# Patient Record
Sex: Female | Born: 1965 | Race: White | Hispanic: No | Marital: Married | Smoking: Never smoker
Health system: Southern US, Community
[De-identification: ages and names within clinical notes are randomized; demographics above are authoritative.]

## PROBLEM LIST (undated history)

## (undated) DIAGNOSIS — M1990 Unspecified osteoarthritis, unspecified site: Secondary | ICD-10-CM

## (undated) DIAGNOSIS — R0602 Shortness of breath: Secondary | ICD-10-CM

## (undated) DIAGNOSIS — Z90710 Acquired absence of both cervix and uterus: Secondary | ICD-10-CM

## (undated) HISTORY — DX: Acquired absence of both cervix and uterus: Z90.710

## (undated) HISTORY — DX: Unspecified osteoarthritis, unspecified site: M19.90

## (undated) HISTORY — PX: REPLACEMENT TOTAL KNEE BILATERAL: SUR1225

## (undated) HISTORY — PX: APPENDECTOMY: SHX54

## (undated) HISTORY — PX: SHOULDER SURGERY: SHX246

## (undated) HISTORY — PX: CHOLECYSTECTOMY: SHX55

## (undated) HISTORY — DX: Shortness of breath: R06.02

---

## 1997-10-23 ENCOUNTER — Ambulatory Visit (HOSPITAL_BASED_OUTPATIENT_CLINIC_OR_DEPARTMENT_OTHER): Admission: RE | Admit: 1997-10-23 | Discharge: 1997-10-23 | Payer: Self-pay | Admitting: Orthopedic Surgery

## 1998-11-12 ENCOUNTER — Ambulatory Visit (HOSPITAL_BASED_OUTPATIENT_CLINIC_OR_DEPARTMENT_OTHER): Admission: RE | Admit: 1998-11-12 | Discharge: 1998-11-12 | Payer: Self-pay | Admitting: Orthopedic Surgery

## 1999-06-17 ENCOUNTER — Emergency Department (HOSPITAL_COMMUNITY): Admission: EM | Admit: 1999-06-17 | Discharge: 1999-06-17 | Payer: Self-pay | Admitting: Emergency Medicine

## 1999-10-07 ENCOUNTER — Encounter: Payer: Self-pay | Admitting: *Deleted

## 1999-10-07 ENCOUNTER — Emergency Department (HOSPITAL_COMMUNITY): Admission: EM | Admit: 1999-10-07 | Discharge: 1999-10-07 | Payer: Self-pay | Admitting: *Deleted

## 2000-04-11 ENCOUNTER — Encounter: Admission: RE | Admit: 2000-04-11 | Discharge: 2000-04-11 | Payer: Self-pay | Admitting: Family Medicine

## 2000-04-11 ENCOUNTER — Encounter: Payer: Self-pay | Admitting: Family Medicine

## 2001-08-08 ENCOUNTER — Ambulatory Visit: Admission: RE | Admit: 2001-08-08 | Discharge: 2001-08-08 | Payer: Self-pay | Admitting: Orthopedic Surgery

## 2002-01-09 ENCOUNTER — Encounter: Admission: RE | Admit: 2002-01-09 | Discharge: 2002-01-09 | Payer: Self-pay | Admitting: Family Medicine

## 2002-01-09 ENCOUNTER — Encounter: Payer: Self-pay | Admitting: Family Medicine

## 2002-01-12 ENCOUNTER — Emergency Department (HOSPITAL_COMMUNITY): Admission: EM | Admit: 2002-01-12 | Discharge: 2002-01-12 | Payer: Self-pay | Admitting: Podiatry

## 2002-02-12 ENCOUNTER — Inpatient Hospital Stay (HOSPITAL_COMMUNITY): Admission: RE | Admit: 2002-02-12 | Discharge: 2002-02-15 | Payer: Self-pay | Admitting: Orthopedic Surgery

## 2002-05-18 ENCOUNTER — Emergency Department (HOSPITAL_COMMUNITY): Admission: EM | Admit: 2002-05-18 | Discharge: 2002-05-18 | Payer: Self-pay | Admitting: Emergency Medicine

## 2002-05-19 ENCOUNTER — Emergency Department (HOSPITAL_COMMUNITY): Admission: EM | Admit: 2002-05-19 | Discharge: 2002-05-19 | Payer: Self-pay | Admitting: Emergency Medicine

## 2002-11-23 ENCOUNTER — Encounter: Payer: Self-pay | Admitting: Family Medicine

## 2002-11-23 ENCOUNTER — Encounter: Admission: RE | Admit: 2002-11-23 | Discharge: 2002-11-23 | Payer: Self-pay | Admitting: Family Medicine

## 2002-12-07 ENCOUNTER — Encounter: Admission: RE | Admit: 2002-12-07 | Discharge: 2002-12-07 | Payer: Self-pay | Admitting: Family Medicine

## 2002-12-21 ENCOUNTER — Encounter: Admission: RE | Admit: 2002-12-21 | Discharge: 2002-12-21 | Payer: Self-pay | Admitting: Family Medicine

## 2003-04-05 ENCOUNTER — Inpatient Hospital Stay (HOSPITAL_COMMUNITY): Admission: RE | Admit: 2003-04-05 | Discharge: 2003-04-08 | Payer: Self-pay | Admitting: Obstetrics and Gynecology

## 2003-04-05 ENCOUNTER — Encounter (INDEPENDENT_AMBULATORY_CARE_PROVIDER_SITE_OTHER): Payer: Self-pay | Admitting: Specialist

## 2003-06-24 ENCOUNTER — Inpatient Hospital Stay (HOSPITAL_COMMUNITY): Admission: RE | Admit: 2003-06-24 | Discharge: 2003-06-26 | Payer: Self-pay | Admitting: Orthopedic Surgery

## 2003-10-22 ENCOUNTER — Other Ambulatory Visit: Admission: RE | Admit: 2003-10-22 | Discharge: 2003-10-22 | Payer: Self-pay | Admitting: Obstetrics and Gynecology

## 2003-12-20 ENCOUNTER — Encounter: Admission: RE | Admit: 2003-12-20 | Discharge: 2003-12-20 | Payer: Self-pay | Admitting: Emergency Medicine

## 2004-07-12 IMAGING — CT CT PELVIS W/ CM
1 series · 15 of 32 positions shown, 19 images · non-contrast
Comparison: none

CLINICAL DATA: 37 year old.  Low back and abdominal pain.  (contrast code:   D0R.N).

[Series 2: — · axial · 0.86mm/px · z∈[-411,-21]mm · 15 of 120 slices shown, 19 images]
[im 8/120  soft-tissue]
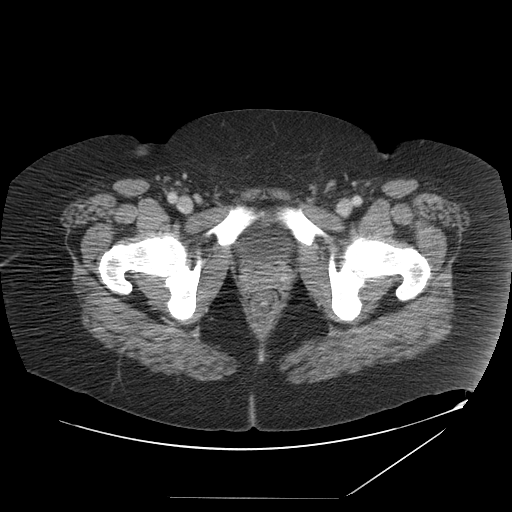
[im 8/120  bone]
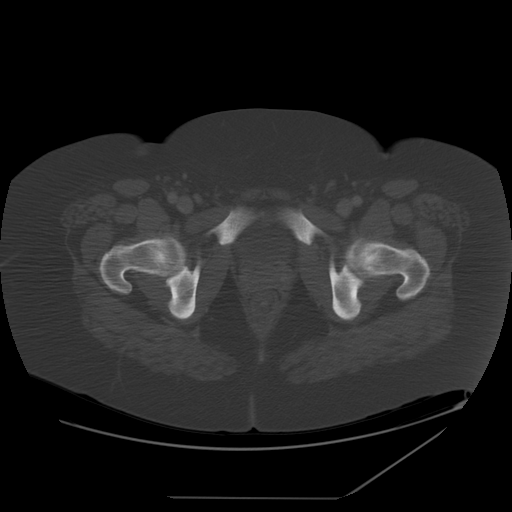
[im 16/120  soft-tissue]
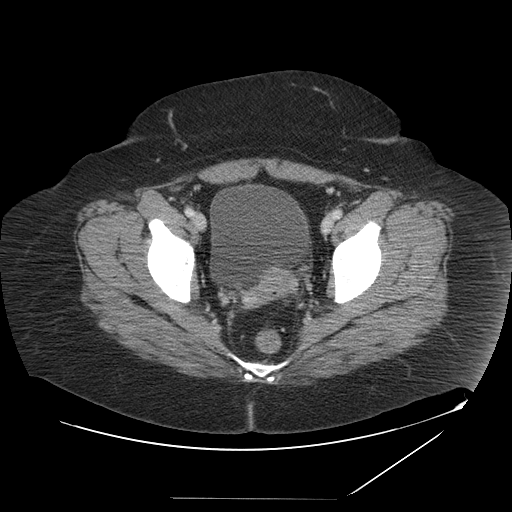
[im 24/120  soft-tissue]
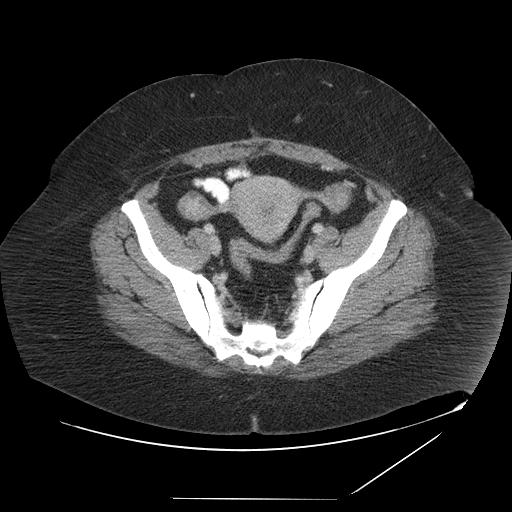
[im 35/120  soft-tissue]
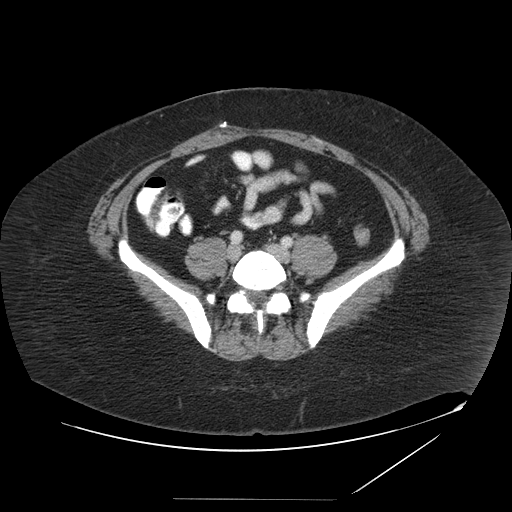
[im 43/120  soft-tissue]
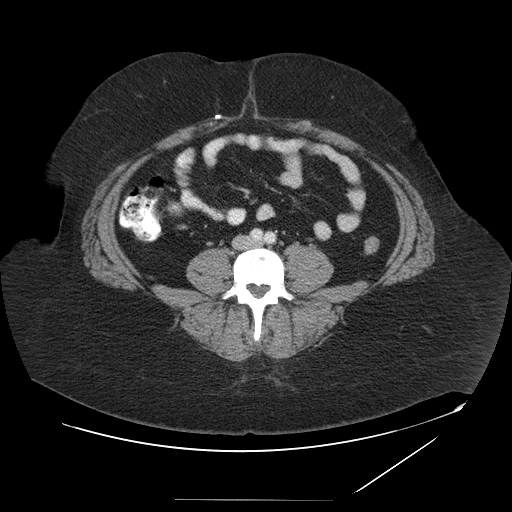
[im 50/120  soft-tissue]
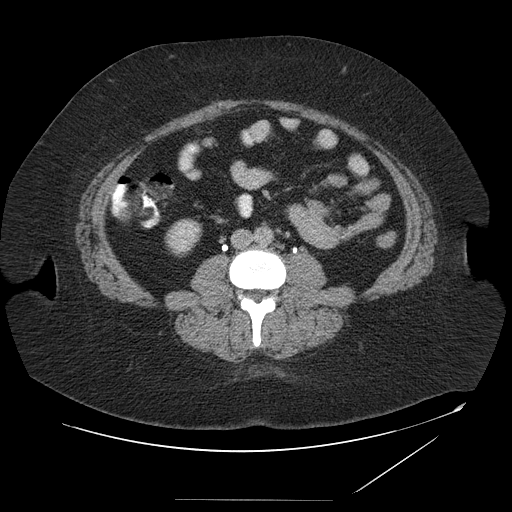
[im 62/120  soft-tissue]
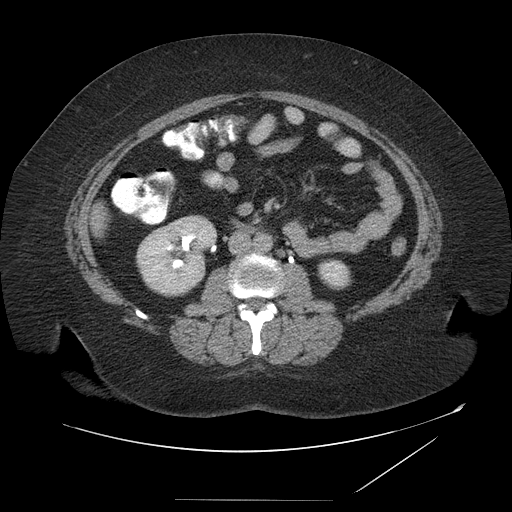
[im 70/120  soft-tissue]
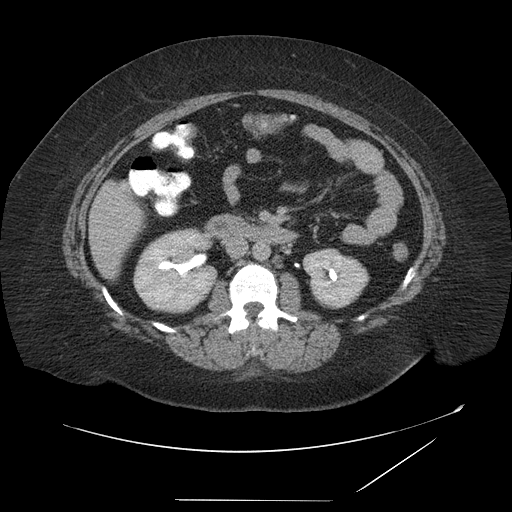
[im 77/120  soft-tissue]
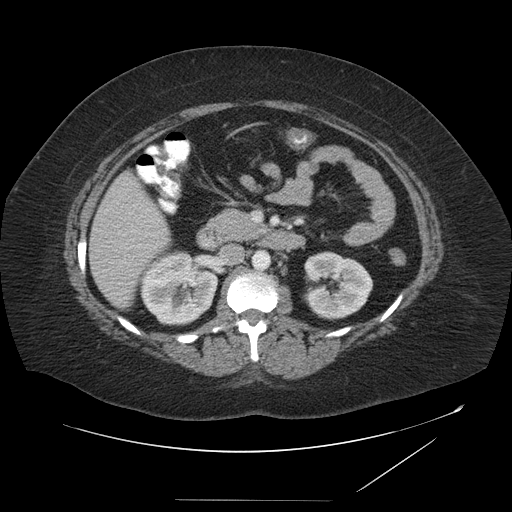
[im 77/120  bone]
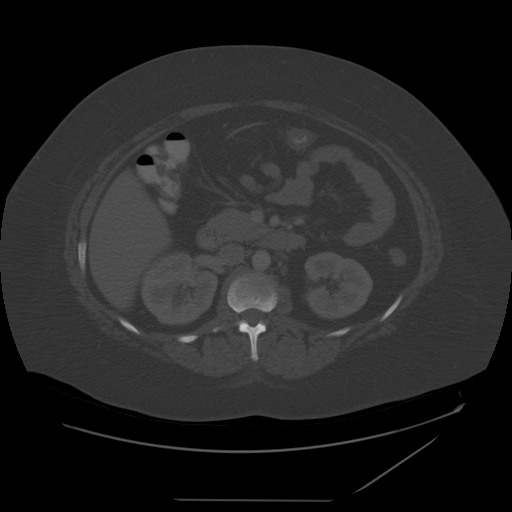
[im 85/120  soft-tissue]
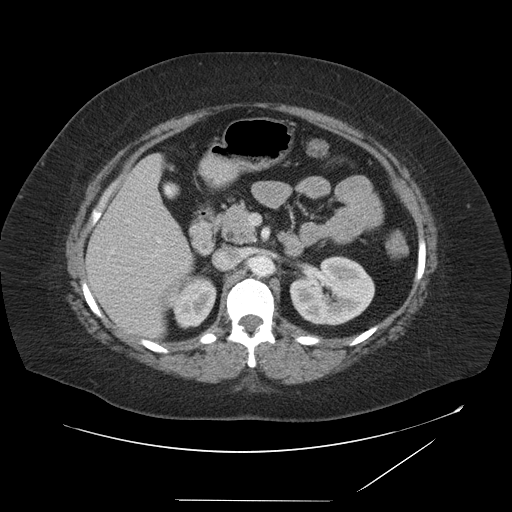
[im 96/120  soft-tissue]
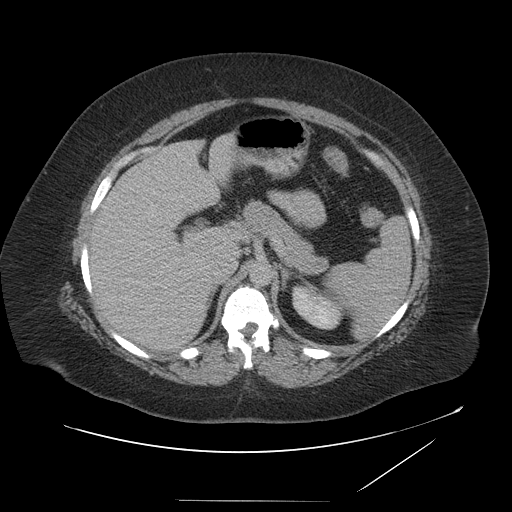
[im 104/120  soft-tissue]
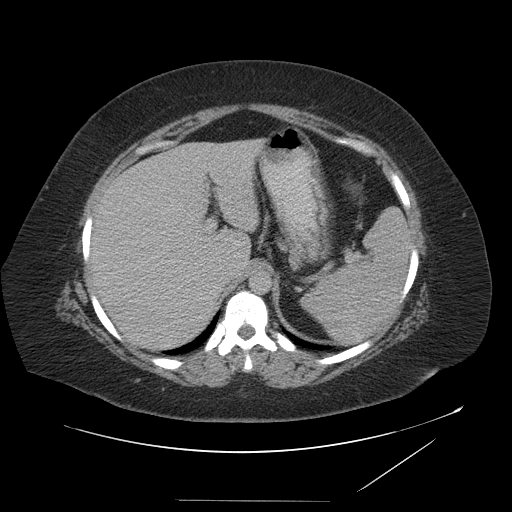
[im 104/120  lung]
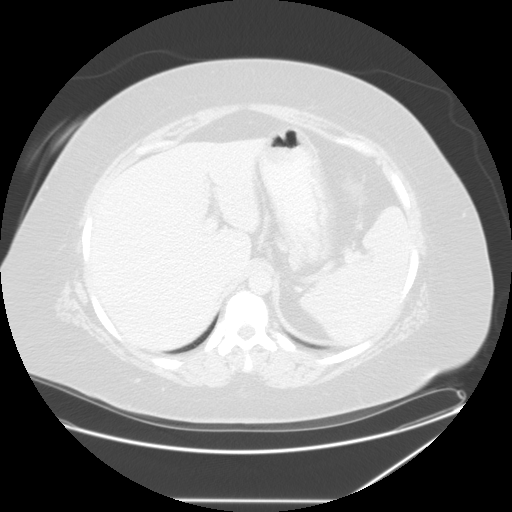
[im 108/120  lung]
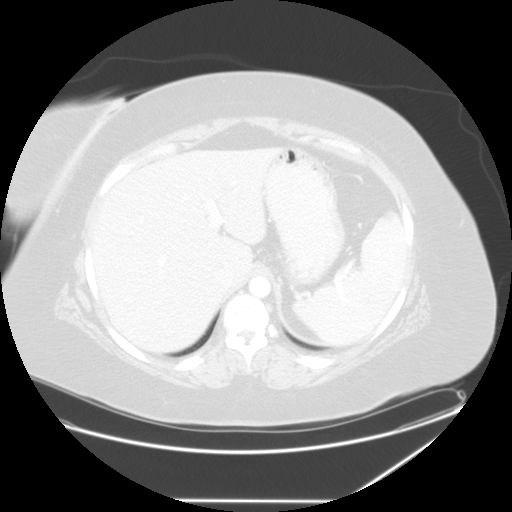
[im 112/120  soft-tissue]
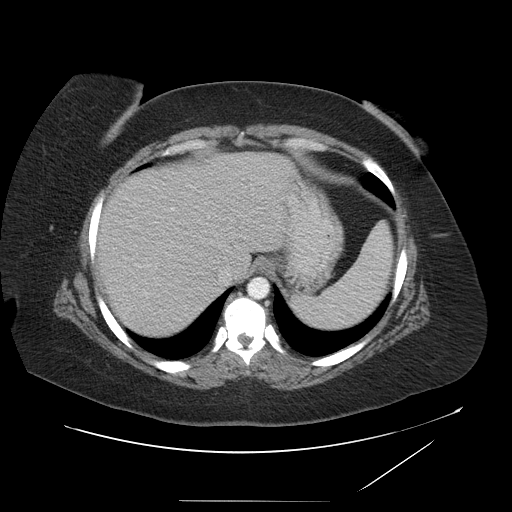
[im 112/120  lung]
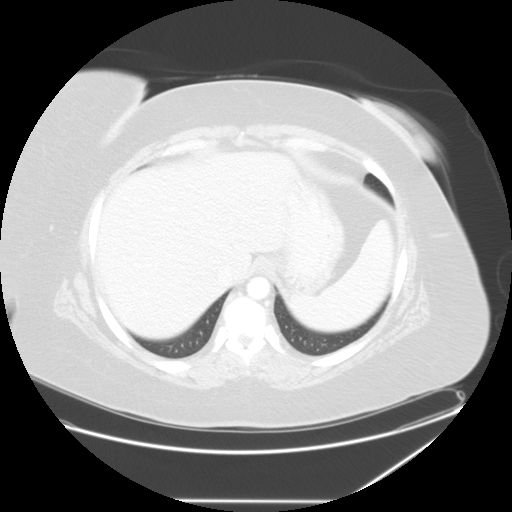
[im 116/120  lung]
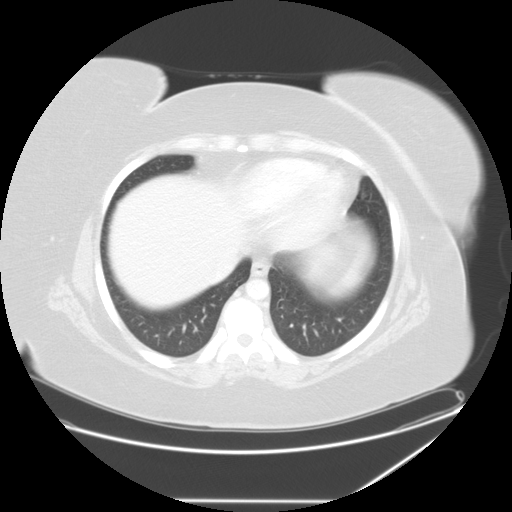

[15 of 32 positions shown; findings below may reference images not displayed]

CT ABDOMEN AND PELVIS WITH CONTRAST
 Helical CT of the abdomen and pelvis was performed after bolus infusion of a total of 150 cc of Omnipaque 300 and the use of dilute oral contrast.  This study is compared to the prior CT from 01/09/02 and also a study from 10/07/99.

 The lung bases are clear.

 CT ABDOMEN:
 The liver, spleen, pancreas, adrenal glands, and kidneys demonstrate no abnormalities.  The patient has had cholecystectomy.  The stomach is fairly well distended and demonstrates no significant findings.  The duodenum, small bowel, and colon appear normal also.  The aorta and major branch vessels are normal.  No mesenteric or retroperitoneal masses or adenopathy.

 Of note the patient appears to have a calcified disk herniation on the right of what I believe is T11-12.  It is possible it could be the cause of the patient?s back pain.  MRI may be helpful for further evaluation of this finding if it is felt clinically necessary.

 IMPRESSION 
   Unremarkable      CT examination of the abdomen.
  Right      paracentral calcified disk herniation at T[DATE] be helpful for further evaluation of this finding if      clinically indicated.  Recommend correlation      with patient?s symptoms.

 CT PELVIS:
 The uterus and ovaries appear normal.  The bladder is normal.  the rectum, sigmoid colon, visualized small bowel loops, and cecum are normal.  No pelvic masses, adenopathy, or free pelvic fluid collections.

 IMPRESSION
 Unremarkable CT pelvis.

## 2005-03-31 ENCOUNTER — Encounter: Admission: RE | Admit: 2005-03-31 | Discharge: 2005-03-31 | Payer: Self-pay | Admitting: Emergency Medicine

## 2006-09-16 ENCOUNTER — Encounter: Admission: RE | Admit: 2006-09-16 | Discharge: 2006-09-16 | Payer: Self-pay | Admitting: Emergency Medicine

## 2007-10-20 ENCOUNTER — Emergency Department (HOSPITAL_COMMUNITY): Admission: EM | Admit: 2007-10-20 | Discharge: 2007-10-20 | Payer: Self-pay | Admitting: Emergency Medicine

## 2008-06-27 ENCOUNTER — Emergency Department (HOSPITAL_COMMUNITY): Admission: EM | Admit: 2008-06-27 | Discharge: 2008-06-27 | Payer: Self-pay | Admitting: Emergency Medicine

## 2008-07-10 ENCOUNTER — Inpatient Hospital Stay (HOSPITAL_COMMUNITY): Admission: AD | Admit: 2008-07-10 | Discharge: 2008-07-10 | Payer: Self-pay | Admitting: Obstetrics and Gynecology

## 2008-07-10 ENCOUNTER — Ambulatory Visit: Payer: Self-pay | Admitting: Obstetrics and Gynecology

## 2010-05-18 LAB — URINALYSIS, ROUTINE W REFLEX MICROSCOPIC
Bilirubin Urine: NEGATIVE
Hgb urine dipstick: NEGATIVE
Ketones, ur: NEGATIVE mg/dL
Nitrite: NEGATIVE
Protein, ur: NEGATIVE mg/dL
Specific Gravity, Urine: 1.01 (ref 1.005–1.030)
Urobilinogen, UA: 0.2 mg/dL (ref 0.0–1.0)

## 2010-05-18 LAB — URINE MICROSCOPIC-ADD ON

## 2010-05-18 LAB — GC/CHLAMYDIA PROBE AMP, URINE: Chlamydia, Swab/Urine, PCR: NEGATIVE

## 2010-06-26 NOTE — H&P (Signed)
NAME:  Paula Fields, Paula Fields NO.:  [id number]   MEDICAL RECORD NO.:  [id number]                   PATIENT TYPE:  OBV   LOCATION:  9399                                 FACILITY:  [facility]   PHYSICIAN:  Juluis Mire, M.D.                DATE OF BIRTH:  May 18, 1965   DATE OF ADMISSION:  04/05/2003  DATE OF DISCHARGE:                                HISTORY & PHYSICAL   REASON FOR ADMISSION:  Patient a 45 year old gravida, para 2, married white  female presents for laparoscopic assisted vaginal hysterectomy.   BRIEF HISTORY:  In relation to the present admission, patient was initially  seen in our practice in October 2004.  At that time her cycles were every 28  days.  She had 7 days of flow.  She described all 7 days being heavy  changing pads 10 times per day with clots.  Associated with this was  increasing dysmenorrhea.  Discomfort began midcycle and increased with her  menstrual flow.  It was so intense at times she could not walk.  She has  used over-the-counter measures without any significant response.  It is of  note she has had a previous bilateral tubal ligation.  She was also having  problems with deep dyspareunia.  This was not as limiting as the menstrual  cramping.  We set up an ultrasound evaluation in the office.  Ultrasound  evaluation revealed a small anterior fibroid but it was otherwise  unremarkable.  We placed her on birth control pills.  Despite these she  continued to have significantly heavy flow with resultant anemia.  Eventually we were able to control the bleeding with increasing doses of  birth control pills and she was eventually on Ovcon-50.  Her hemoglobin  again was slightly depressed at 11.3.  After continued bleeding problems and  pain the patient decided to proceed with definitive therapy in the form of  laparoscopic assisted vaginal hysterectomy.  Alternatives have been  discussed including possible continuation with birth control  pills.  Hysteroscopy/laparoscopy with possible endometrial ablation.   ALLERGIES:  She has no known drug allergies.   MEDICATIONS:  None.   PAST MEDICAL HISTORY:  She has had usual childhood diseases without any  significant sequelae.   PAST SURGICAL HISTORY:  She has had a tonsillectomy.  She has had several  knee surgeries on both the right and left.  She has had her gallbladder  removed in 2001.  She had an appendectomy at 11th grade.  She has had  previous shoulder surgery.  She has had two prior cesarean sections with a  bilateral tubal ligation done with the last one.   FAMILY HISTORY:  Significant in that her sister has a history of lupus  erythematosus, mother with a history of heart disease with triple bypass  surgery.   SOCIAL HISTORY:  No tobacco or alcohol use.  REVIEW OF SYSTEMS:  Noncontributory.   PHYSICAL EXAMINATION:  GENERAL:  Patient is afebrile with stable vital  signs.  HEENT:  Patient is normocephalic.  Pupils equal, round and reactive to light  and accommodation.  Extraocular movements are intact.  Sclerae and  conjunctivae clear.  Oropharynx clear.  NECK:  Without thyromegaly.  BREASTS:  No discrete masses.  LUNGS:  Clear.  CARDIOVASCULAR SYSTEM:  Regular rate.  No murmurs or gallops.  ABDOMEN:  Benign.  She does have numerous incisions.  She has one in the  right lower quadrant from her previous appendectomy and a low transverse  incision from C-sections.  PELVIC:  Normal external genitalia.  Vaginal mucosa clear.  Cervix  unremarkable.  Uterus upper limits of normal size.  Adnexa unremarkable.  Rectovaginal exam is clear.  EXTREMITIES:  Trace edema.  NEUROLOGIC:  Grossly within normal limits.   IMPRESSION:  Menorrhagia/dysmenorrhea unresponsive to conservative  management.   PLAN:  The patient to undergo laparoscopic assisted vaginal hysterectomy.  Again she has reviewed other options.  It is of note she has had all normal  Pap smears.  Her  last one was in October 2004 with her primary care doctor.  We have discussed the risk of surgery including the risk of infection.  The  risk of hemorrhage that can necessitate transfusion with the risk of AIDS or  hepatitis.  The risk of injury to adjacent organs including bladder, bowel,  or ureters that could require further exploratory surgery.  Risk of deep  venous thrombosis and pulmonary embolus.  Patient expressed understanding of  indications and risks.                                               Juluis Mire, M.D.    JSM/MEDQ  D:  04/05/2003  T:  04/05/2003  Job:  [id number]

## 2010-06-26 NOTE — H&P (Signed)
Paula Fields, Paula Fields                           ACCOUNT NO.:  [id number]   MEDICAL RECORD NO.:  [id number]                   PATIENT TYPE:  INP   LOCATION:  0470                                 FACILITY:  [facility]   PHYSICIAN:  Ollen Gross, M.D.                 DATE OF BIRTH:  Dec 02, 1965   DATE OF ADMISSION:  06/24/2003  DATE OF DISCHARGE:  06/26/2003                                HISTORY & PHYSICAL   CHIEF COMPLAINT:  Left knee pain.   HISTORY OF PRESENT ILLNESS:  The patient is a 45 year old female, well known  to Dr. 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  She has previously had the right knee replaced and has done  quite well with this.  The left knee has continued to increase and become  more symptomatic.  She was seen in office and had advanced patellofemoral  arthritis.  It was felt due to the fact she has done well with a previous  surgery, she would benefit from undergoing a left knee patellofemoral  replacement.  Risks and benefits of this surgery have been discussed with  the patient who elected to proceed with surgery.   ALLERGIES:  RELAFEN causes sickness.   CURRENT MEDICATIONS:  Aleve p.r.n.Marland Kitchen   PAST MEDICAL HISTORY:  Arthritis.   PAST SURGICAL HISTORY:  1. Multiple knee surgeries with 8 surgeries on the right knee and 3     surgeries on the left knee.  2. She has also had right shoulder surgery.  3. Cesarean section x 2.  4. Gallbladder surgery.  5. Appendix surgery.  6. Tonsillectomy.  7. Hysterectomy.   SOCIAL HISTORY:  Married.  She is currently on disability, nonsmoker, no  alcohol, has two children, one-story home, no steps.   FAMILY HISTORY:  Father living age 2, is healthy.  Mother age 65.  She has  had two brain surgeries and also has had a three-vessel CABG.  She has a  sister, approximately age 50 with lupus, another sister age 8 who has also  undergone brain surgery.   REVIEW OF SYSTEMS:  GENERAL:  No  fevers, chills, or night sweats.  NEUROLOGIC:  No seizures, syncope, paralysis.  RESPIRATORY:  No shortness of  breath, productive cough, or hemoptysis.  CARDIOVASCULAR:  No chest pain,  angina, or orthopnea.  GI:  No nausea, vomiting, diarrhea, or constipation.  GU:  No dysuria, hematuria, or discharge.  MUSCULOSKELETAL:  Pertinent to  the left knee found in the history of present illness.   PHYSICAL EXAMINATION:  VITAL SIGNS:  Pulse 68, respirations 12, blood  pressure 110/78.  GENERAL:  A 45 year old white female, well-nourished, well-developed, short  stature, overweight, no acute distress.  HEENT:  Normocephalic, atraumatic.  Pupils are round and reactive.  Oropharynx is clear.  NECK:  Supple.  No carotid bruits.  CHEST:  Clear anterior and posterior chest  walls.  No rhonchi, rales, or  wheezing.  HEART:  Regular rate and rhythm.  ABDOMEN:  Soft, protuberant abdomen.  Bowel sounds are present, nontender.  RECTAL/BREASTS/GENITALIA:  Not done.  Not pertinent to present illness.  EXTREMITIES:  Significant to the left knee.  She does have marked crepitus  noted on passive range of motion with pain with extreme extension.  No  effusion.  Positive patella compression test on exam.  Motor function is  intact.   IMPRESSION:  Left knee patellofemoral arthritis.   PLAN:  The patient will be admitted to [facility] to undergo a  left patellofemoral replacement.  Surgery will be performed by Dr. Ollen Gross.     Alexzandrew L. Julien Girt, P.A.              Ollen Gross, M.D.    ALP/MEDQ  D:  07/04/2003  T:  07/05/2003  Job:  [id number]   cc:   L. Lupe Carney, M.D.  [address] [place]  [place] [address]  Fax: [phone]   Ollen Gross, M.D.  [facility]  [address]  [address]  [place]  [place] [address]  Fax: [phone]

## 2010-06-26 NOTE — H&P (Signed)
Paula Fields, Paula Fields                           ACCOUNT NO.:  [id number]   MEDICAL RECORD NO.:  [id number]                   PATIENT TYPE:  INP   LOCATION:  0457                                 FACILITY:  [facility]   PHYSICIAN:  Ollen Gross, M.D.                 DATE OF BIRTH:  [birth year]   DATE OF ADMISSION:  02/12/2002  DATE OF DISCHARGE:                                HISTORY & PHYSICAL   CHIEF COMPLAINT:  Right knee pain.   HISTORY OF PRESENT ILLNESS:  The patient is a 45 year old female who has  been followed by Ollen Gross, M.D. for ongoing right knee pain.  She has  had multiple surgeries on the right knee dating back through 1994, most  recently having bilateral knee scopes back in 2003.  She is known to have  advanced patellofemoral arthritis and has not had much improvement with any  of the previous procedures or treatment.  Her arthritic changes are  localized and isolated to the patellofemoral area of the right knee.  She  continues to have knee pain which interferes with her activity.  Due to the  fact that she has not improved with previous treatments and the fact that  her arthritis is located to the specific area of the patellofemoral region,  it is felt that she could benefit from undergoing a patellofemoral  replacement.  This surgery and the risks and benefits associated with it  have been discussed with the patient at length and she has elected to  proceed with surgery.   ALLERGIES:  No known drug allergies.  Intolerances:  RELAFEN causes  sickness.   CURRENT MEDICATIONS:  None.   PAST MEDICAL HISTORY:  1. History of gallbladder disease.  2. History of bilateral ankle fractures.   PAST SURGICAL HISTORY:  1. Cholecystectomy in 1999.  2. Appendectomy in the 11th grade.  3. She has undergone a tonsillectomy in 1970.  4. She has had two cesarean sections in 1984 and 1988.  5. She has had multiple right knee surgeries dating back from 1994 up until     this  past year 2003.  She also had left knee surgery in 2003.   SOCIAL HISTORY:  Married.  Nonsmoker.  Very seldom intake of alcohol.  She  has two children.  One-story home.  She does have a ramp entering into her  home.   FAMILY HISTORY:  Her mother who is age 26 with coronary arterial disease,  hypertension, and arthritis.  Father age 40 with arthritis.  Grandmother  with a history of stroke and stomach cancer.  COPD, asthma, and lupus also  run in the family.   REVIEW OF SYSTEMS:  GENERAL:  No fevers, chills, night sweats.  NEUROLOGIC:  No seizures, syncope, paralysis.  RESPIRATORY:  No shortness of breath,  productive cough, or hemoptysis.  CARDIOVASCULAR:  No chest pain, angina,  orthopnea.  GASTROINTESTINAL:  No nausea, vomiting, diarrhea, constipation.  No bloody mucus in the stool.  GENITOURINARY:  No dysuria, hematuria, or  discharge.  MUSCULOSKELETAL:  Pertinent to the right knee found in history  of present illness.   PHYSICAL EXAMINATION:  VITAL SIGNS:  Pulse 68, respirations 14, blood  pressure 138/82.  GENERAL:  The patient is a 45 year old white female well-nourished, well-  developed, overweight, short stature.  She is alert, oriented, cooperative.  HEENT:  Normocephalic, atraumatic.  Pupils round, reactive.  Oropharynx  clear.  EOMs are intact.  NECK:  Supple.  No carotid bruits are appreciated.  CHEST:  Clear to auscultation anterior/posterior chest walls.  HEART:  Regular rate and rhythm.  No murmurs.  S1, S2 noted.  ABDOMEN:  Soft, round protuberant abdomen.  Bowel sounds are present.  RECTAL:  Not done.  Not pertinent to present illness.  BREASTS:  Not done.  Not pertinent to present illness.  GENITALIA:  Not done.  Not pertinent to present illness.  EXTREMITIES:  Right lower extremity:  Shows range of motion of 0-105  degrees.  No instability.  She has moderate crepitus noted on passive range  of motion.  She has some patella compression pain.   IMPRESSION:   Right knee patellofemoral arthritis.   PLAN:  The patient will be admitted to [facility] to undergo a  right knee patellofemoral replacement arthroplasty.  Surgery will be  performed by Ollen Gross, M.D.  The patient's medical physician is L. Lupe Carney, M.D.  Elsworth Soho, M.D. will be notified of the room number  on admission, be consulted if needed for any medical assistance with this  patient throughout the hospital course.     Paula Fields, P.A.              Ollen Gross, M.D.    ALP/MEDQ  D:  02/13/2002  T:  02/13/2002  Job:  [id number]   cc:   Ollen Gross, M.D.  [address]  [place]  [place] [address]  Fax: [phone]   L. Lupe Carney, M.D.  [address] [place]  [place] [address]  Fax: [phone]

## 2010-06-26 NOTE — Discharge Summary (Signed)
Paula Fields, Paula Fields                           ACCOUNT NO.:  [id number]   MEDICAL RECORD NO.:  [id number]                   PATIENT TYPE:  INP   LOCATION:  0457                                 FACILITY:  [facility]   PHYSICIAN:  Ollen Gross, M.D.                 DATE OF BIRTH:  08-May-1965   DATE OF ADMISSION:  02/12/2002  DATE OF DISCHARGE:  02/15/2002                                 DISCHARGE SUMMARY   ADMISSION DIAGNOSIS:  Right knee patellofemoral arthritis.   DISCHARGE DIAGNOSIS:  Right knee patellofemoral arthritis status post right  knee patellofemoral arthroplasty.   PROCEDURE:  The patient was taken to the OR on February 12, 2002, underwent a  right knee patellofemoral arthroplasty.   SURGEON:  Ollen Gross, M.D.   ASSISTANT:  Alexzandrew L. Julien Girt, P.A.   ANESTHESIA:  Surgery under general anesthesia.   ESTIMATED BLOOD LOSS:  Minimal.   DRAIN:  Hemovac drain x1.   TOURNIQUET TIME:  Is 51 minutes at 300 mmHg.   BRIEF HISTORY:  The patient is a 45 year old female followed by Dr. Lequita Halt  for ongoing right knee pain.  She has undergone multiple surgeries in the  right knee dating back through 1994, most recent bilateral knee scopes back  in 2003.  She was known to have advanced patellofemoral arthritis and has  not had much improvement with the previous procedures and treatment.  The  arthritic changes appear to be localized at the patellofemoral region in the  right knee and it started to interfere with her activities.  Due to the  localization of the arthritis in the patellofemoral region, it was felt that  the patient could benefit from undergoing a patellofemoral arthroplasty  replacement.  Risks and benefits of this procedure have been discussed with  the patient at length and she has elected to proceed with surgery.   LABORATORY DATA:  CBC on admission:  Hemoglobin 12.4, hematocrit of 37.2,  white cell count 7.6, red cell count 4.64.  Postop H&H 11.4 and 34.3  and  last noted H&H 10.9 and 32.4.  Differential within normal limits on  admission CBC.  PT/PTT on admission was 13.4 and 35 respectively and serial  pro times followed per Coumadin protocol.  Last noted PT/INR 28.7 and 3.4.  CHEM panel on admission all within normal limits with the exception of a low  glucose of 59.  Followup BMET glucose back up to 171, BUN down from 9 to 5,  otherwise electrolytes all within normal limits.  Urinalysis on admission  was negative.  Blood group type O positive.   HOSPITAL COURSE:  The patient was admitted to [facility], taken to  the OR, underwent the above-stated procedure without complication.  The  patient tolerated the patient well, later sent to the recovery room and then  to the orthopedic floor for continued postoperative care where she received  24  hours of postop IV antibiotics.  She was placed on PCA analgesics for  pain control following surgery and supplemented by p.o. meds.  Hemovac drain  was pulled on postop day #1 without difficulty.  PCA was discontinued along  with the IV on postop day #2 when she was weaned over to p.o. meds.  PT was  consulted to assist with gait training ambulation following the surgery.  She was up ambulating 70 feet by postop day #1 and increased up to 110 feet  by postop day #2.  Dressing change initiated on postop day #2.  Incision was  healing very well, staples intact.  By day #3, she was doing much better,  been weaned over to p.o. meds, tolerating her therapy, and it was found the  patient could be discharged home.   DISCHARGE PLAN:  1. The patient discharged home on February 15, 2002.  2. Discharge diagnosis, please see above.  3. Discharge meds:     a. Coumadin as per pharmacy protocol.     b. Percocet for pain.     c. Robaxin for spasm.  4. Diet:  As tolerated.  5. Follow up:  End of the next week following the discharge.  Call the     office for an appointment at [phone].  6. Activity:   Weightbearing as tolerated.  Knee immobilizer when she is up.     Home health PT, home health nursing through [facility].   DISPOSITION:  To home.   CONDITION ON DISCHARGE:  Improved.     Alexzandrew L. Julien Girt, P.A.              Ollen Gross, M.D.    ALP/MEDQ  D:  03/14/2002  T:  03/14/2002  Job:  [id number]

## 2010-06-26 NOTE — Discharge Summary (Signed)
Paula Fields, FRANTA                           ACCOUNT NO.:  [id number]   MEDICAL RECORD NO.:  [id number]                   PATIENT TYPE:  INP   LOCATION:  0470                                 FACILITY:  [facility]   PHYSICIAN:  Ollen Gross, M.D.                 DATE OF BIRTH:  [birth year]   DATE OF ADMISSION:  06/24/2003  DATE OF DISCHARGE:  06/26/2003                                 DISCHARGE SUMMARY   ADMISSION DIAGNOSIS:  Left knee patellofemoral arthritis.   DISCHARGE DIAGNOSIS:  Left knee patella femoral arthritis, status post left  patellofemoral arthroplasty.   CONSULTATIONS:  None.   PROCEDURE:  On 06/24/2003 left patellofemoral arthroplasty.   SURGEON:  Ollen Gross, M.D.   ASSISTANT:  Paula Fields, P.A.C.   ANESTHESIA:  General; postop Marcaine pain pump.   BLOOD LOSS:  Minimal.   DRAINS:  Hemovac drain x1.   TOURNIQUET TIME:  35 minutes at 300 mmHg.   BRIEF HISTORY:  Paula Fields is a 45 year old female with known patellofemoral  arthritis, bilateral knees.  She has successfully undergone a right  patellofemoral arthroplasty.  She is doing well. She has had ongoing  problems regards to her left knee.  Temporary arthroscopic treatments have  provided her temporary relief, but she continues to have disabling pain.  She now presents for patellofemoral arthroplasty on the left.   LABORATORY DATA:  CBC on admission hemoglobin 10.8, hematocrit of 30.0,  white cell count 7.6, red cell count 4.6 with differential well within  normal limits.  Postoperative H&H 10.0 and 31.1; last H&H 9.8 and 30.4.  PT  and PTT preop 13.2 and 32 respectively with an INR of 1.0.  Serum protein is  followed.  Last PT/INR 16.6 and 1.5.  Chem-12 on admission all within normal  limits.  Follow up BMET, sodium dropped from 139 to 130; glucose went up  from 103 to 156; calcium dropped from 8.8 to 8.0; BUN dropped from 7 to 5.  Preop UA moderate leukocyte esterase, a few epithelial cells,  21-50 white  cells, 0-2 red cells, many bacteria; treated preoperatively.  Recheck UA on  admission negative.  Blood group type O positive.  EKG, preop, 06/17/2003,  normal sinus rhythm, normal EKG, no previous tracings, confirmed by Dr.  Jaclyn Fields. Paula Fields.  A chest x-ray report dated 06/17/2003 no active disease.   HOSPITAL COURSE:  The patient admitted to [facility], taken to the  OR, underwent the above stated procedure without complications.  The patient  tolerated the procedure well.  She was later transferred to recovery room  and then __________ for continued  postop care.  Vital signs were followed.  The patient was given 24 hours postoperative IV antibiotics in the form of  Ancef and Coumadin for 2 weeks; negative prophylaxis.  PT and OT were  consulted.  The patient was placed to weight bearing as tolerated.  Hemovac  drain placed on at surgery; was pulled on postoperative day 1.  PCA and IVs  were discontinued also on day 1.  She did have a little bit of pain, but  otherwise doing fairly well.  By day 2 she was even doing better; starting  to get up to the chair and start ambulating with physical therapy.  She  ambulated approximately 100 feet and felt like she was doing well and that  she would be ready for discharge the following day, although Dr. Lequita Fields saw  her later that afternoon.  She was doing quite well and actually wanted to  go home on day 2.   She was ambulating well, incision was healing well, tolerating her meds and  was discharged __________ .   DISCHARGE/PLAN:  1. The patient was discharged home on 06/26/2003.  2. For discharge diagnoses please see above.  3. Discharge Meds:  Percocet, Robaxin, and Coumadin.  4. Diet:  As tolerated.  5. Activity:  Weightbearing as tolerated.  Home heath PT and home health     nursing for knee protocol.  Patellofemoral will be treated as like total     knee protocol as her PT and OT.  6. Follow up:  Two weeks from  surgery.  7. Disposition:  Home.   CONDITION ON DISCHARGE:  Improved.     Paula L. Paula Fields, P.A.              Ollen Gross, M.D.    ALP/MEDQ  D:  07/24/2003  T:  07/24/2003  Job:  [id number]   cc:   Ollen Gross, M.D.  [facility]  [address]  [address]  [place]  [place] [address]  Fax: [phone]   L. Paula Fields, M.D.  [address] [place]  [place] [address]  Fax: [phone]

## 2010-06-26 NOTE — Op Note (Signed)
Paula Fields, [name]                           ACCOUNT NO.:  [id number]   MEDICAL RECORD NO.:  [id number]                   PATIENT TYPE:  INP   LOCATION:  0001                                 FACILITY:  [facility]   PHYSICIAN:  Ollen Gross, M.D.                 DATE OF BIRTH:  [birth year]   DATE OF PROCEDURE:  02/12/2002  DATE OF DISCHARGE:                                 OPERATIVE REPORT   PREOPERATIVE DIAGNOSES:  Right knee patellofemoral arthritis.   POSTOPERATIVE DIAGNOSES:  Right knee patellofemoral arthritis.   PROCEDURE:  Right knee patellofemoral arthroplasty.   SURGEON:  Ollen Gross, M.D.   ASSISTANT:  Avel Peace, P.A.-C.   ANESTHESIA:  General.   ESTIMATED BLOOD LOSS:  Minimal.   DRAINS:  Hemovac x1.   COMPLICATIONS:  None.   TOURNIQUET TIME:  51 minutes at 300 mmHg.   CONDITION:  Stable to recovery.   BRIEF CLINICAL NOTE:  Paula Fields is a 45 year old female with a long history of  patellofemoral problems. She has had multiple procedure procedures including  arthroscopies, lateral release, tibial osteotomy and chondroplasties. She  has had persistent patellofemoral pain with documented patellofemoral  arthritis. She has failed nonoperative management and multiple options were  discussed with the patient. She opted for patellofemoral arthroplasty. She  presents now for the above mentioned procedure.   DESCRIPTION OF PROCEDURE:  After successful administration of general  anesthetic, a tourniquet was placed high on the right thigh, right lower  extremity prepped and draped in the usual sterile fashion. Extremities  wrapped in Esmarch, knee flexed, tourniquet inflated to 300 mmHg. We used a  standard midline incision incorporating her old osteotomy incision. Her skin  was cut with a 10 blade through the subcutaneous tissue to the level of the  extensor mechanism and a fresh blade was used to make a medial parapatellar  arthrotomy. The patella would not evert and  we did a lateral release which  then allowed it to evert. The knee was flexed to 90 degrees and the trochlea  did have focal grade 4 changes. The outline for the trochlear replacement is  put up to the trochlear for sizing. A size small was the most appropriate  and we outlined that with a marking pen. We then used a combination of  osteotome and curette to remove the remaining cartilage. A high speed bur  was used to contour the subchondral bone to allow for placement of the  patellofemoral trial. Once we got it contoured appropriately, the trial sat  in there beautifully with inset along all the edges. We felt there was  excellent fit on the trochlea. We then did the three drill holes through the  template and placed the trial prosthesis and it remained inset very nicely.  The patella was again everted, thickness measured to be 22 mm and we did a  free hand resection of 9.5  mm. The patella was found to be level and we put  the size small drill guide and drilled the three lug holes.  The trial  patella was placed and tracks perfectly normally in the trochlea with no  subluxation and no catching throughout range of motion. Once this was  completed, all the trials were removed and the bone surface prepared with  pulsatile lavage. The cement is mixed and once ready for implantation, the  size small trochlear implant is impacted and all extruded cement removed.  The small LCS rotating patella is then placed also and held with the  patellar clamp. All extruded cement is removed. Once the cement is fully  hardened, the knee is taken through a range of motion and the patella is  found to track normally throughout with no evidence of any catching along  the edges. The wound is then copiously irrigated and the medial arthrotomy  closed with interrupted #1 PDS over a Hemovac drain. We left the lateral  release opened and let the tourniquet down for a total time of 51 minutes.  Minor bleeding stopped  with cautery. The subcu is closed with interrupted 2-  0 Vicryl and skin with staples. A bulky sterile dressing was applied. She  was placed in the knee immobilizer, awakened and the drains hooked to  suction and she was transported to recovery in stable condition.                                               Ollen Gross, M.D.    FA/MEDQ  D:  02/12/2002  T:  02/12/2002  Job:  [id number]

## 2010-06-26 NOTE — Discharge Summary (Signed)
Paula Fields, SIDNEY                           ACCOUNT NO.:  [id number]   MEDICAL RECORD NO.:  [id number]                   PATIENT TYPE:  INP   LOCATION:  9302                                 FACILITY:  [facility]   PHYSICIAN:  Juluis Mire, M.D.                DATE OF BIRTH:  10-Jan-1966   DATE OF ADMISSION:  04/05/2003  DATE OF DISCHARGE:  04/07/2003                                 DISCHARGE SUMMARY   ADMISSION DIAGNOSIS:  Abnormal uterine bleeding and pelvic pain.   DISCHARGE DIAGNOSES:  1. Abnormal uterine bleeding.  2. Pelvic pain.  3. Pelvic adhesions.   OPERATIVE PROCEDURE:  Open laparoscopy, subsequent exploratory laparotomy  with total abdominal hysterectomy  and left salpingo-oophorectomy.   HISTORY:  For complete history and physical see dictated note.   HOSPITAL COURSE:  The patient underwent the above noted surgery.  Had  extensive pelvic abdominal adhesions.  Underwent total abdominal  hysterectomy and left salpingo-oophorectomy for management.  Right ovary was  left in place, appeared normal.  She did have problems with postoperative  ileus requiring one extra day in the hospital.  At that time she had  increasing abdominal cramping, poor p.o. intake, and hyperactive bowel  sounds.  She was given GI stimulation with a Dulcolax suppository.  She did  well throughout April 07, 2003 and was subsequent discharged home on  April 08, 2003.  At that time it was her third postoperative day, she was  afebrile with stable vital signs.  Abdomen was soft, bowel sounds were  active and normal, she was passing flatus.  Low transverse and subumbilical  incision were intact.  She had no active vaginal bleeding.  Postoperative  hemoglobin was 9.2. _________ The patient was discharged home in stable  condition.   DISPOSITION:  The patient is discharged home on Tylox p.r.n. for pain.  She  is to avoid heavy lifting, vaginal entrance or driving a car.  She is to  watch for signs  of infection, nausea, vomiting, increasing abdominal pain,  active vaginal bleeding.   FOLLOW UP:  Follow up in the office will be in one week.                                               Juluis Mire, M.D.    JSM/MEDQ  D:  04/08/2003  T:  04/08/2003  Job:  [id number]

## 2010-06-26 NOTE — Op Note (Signed)
[name]  [name], [name]                           ACCOUNT NO.:  [id number]   MEDICAL RECORD NO.:  [id number]                   PATIENT TYPE:  OBV   LOCATION:  9399                                 FACILITY:  [facility]   PHYSICIAN:  Juluis Mire, M.D.                DATE OF BIRTH:  11/08/1965   DATE OF PROCEDURE:  04/05/2003  DATE OF DISCHARGE:                                 OPERATIVE REPORT   PREOPERATIVE DIAGNOSIS:  Menorrhagia with associated pelvic pain.   POSTOPERATIVE DIAGNOSES:  1. Menorrhagia with associated pelvic pain.  2. Pelvic adhesive process.   OPERATIVE PROCEDURES:  1. Open laparoscopy.  2. Subsequent exploratory laparotomy.  3. Total abdominal hysterectomy with left salpingo-oophorectomy.   SURGEON:  Juluis Mire, M.D.   ASSISTANT:  Raynald Kemp, M.D.   ANESTHESIA:  General endotracheal.   ESTIMATED BLOOD LOSS:  Between 300 and 450 mL.   PACKS AND DRAINS:  None.   INTRAOPERATIVE BLOOD REPLACED:  None.   COMPLICATIONS:  None.   INDICATIONS:  Dictated in the history and physical.   The procedure was as follows:  The patient was taken to the OR and placed in  the supine position.  After a satisfactory level of general anesthesia  obtained, the patient was placed in a dorsal lithotomy position using the  Allen stirrups.  The abdomen, perineum, and vagina were prepped out with  Betadine.  The bladder was emptied by in-and-out catheterization.  A Hulka  tenaculum was put in place and secured.  The patient was draped out for  surgery.  A subumbilical incision made with a knife.  The incision was  extended through the subcutaneous tissue.  The fascia was entered sharply  and the incision in the fascia extended laterally.  The peritoneum was then  entered.  An open laparoscopic trocar was put in place and the abdomen was  inflated with carbon dioxide.  The laparoscope was introduced.  Visualization revealed omental adhesions to the subumbilical area and right  lower quadrant probably from the appendectomy.  We were able to look into  the pelvic cavity.  A 5 mm trocar was put into place in the suprapubic area.  There were dense adhesions between the anterior aspect of the uterus and the  abdominal wall from her prior cesarean sections.  The sigmoid colon was  densely adherent to the left ovary.  Due to the dense adhesions, a decision  was made to proceed with laparotomy and hysterectomy.  The abdomen was  deflated of its carbon dioxide, all trocars removed, the subumbilical fascia  closed with two figure-of-eights of 0 Vicryl.  The skin was closed with a  running subcuticular 4-0 Vicryl.   The Hulka tenaculum was then removed.  A Foley was placed to straight drain.  A low transverse skin incision was made with a knife, carried through the  subcutaneous tissue.  The fascia  was entered sharply and the incision in the  fascia extended laterally.  The fascia was taken off the muscle superiorly  and inferiorly.  Rectus muscles were separated in the midline.  The  peritoneum was entered sharply and the incision in the peritoneum extended  both superiorly and inferiorly.  The O'Connor-O'Sullivan retractor was put  in place and bowel contents were packed superiorly out of the pelvic cavity.  Dense adhesions to the right side of the uterine fundus were taken down  using two Kellys.  These pedicles were secured with free ties of 0 Vicryl.  Next the right round ligament was identified and clamped, cut, and suture  ligated with 0 Vicryl.  The left uterine-ovarian pedicle was then developed,  clamped, cut, secured with a free tie of 0 Vicryl and a suture ligature of 0  Vicryl.  She had dense adherence to the anterior aspect of the uterus.  Using sharp dissection we were able to free this area up and develop the  bladder flap.  Next the left round ligament was clamped, cut, and suture  ligated with 0 Vicryl.  The left utero-ovarian pedicle was isolated,   clamped, cut, and doubly ligated, first with a free tie of 0 Vicryl, then a  suture ligature of 0 Vicryl.  We evaluated the bladder area, and the bladder  was dissected down nicely.  Urine output remained clear.  Using the clamp,  cut, and tie technique with suture ligature of 0 Vicryl, the parametrium was  serially separated from the sides of the uterus.  Vaginal angles were  clamped and cut.  Intervening vaginal mucosa was excised, the uterus passed  off the operative field.  Held pedicles were secured with two suture  ligatures of 0 Vicryl.  The intervening vaginal mucosa was closed with two  figure-of-eights of 0 Vicryl.  Some bleeding from the left side of the  vaginal fornices was identified and brought under control with  figure-of-  eight of 0 Vicryl.  We thoroughly irrigated the pelvis.  We had good  hemostasis at the vaginal cuff area.  We then evaluated the left ovary.  There was some bleeding from the vasculature.  We identified the left  ureter, isolated the vasculature above it, clamped it, and removed the ovary  on the left side.  The vascular pedicle was controlled with two free ties of  0 Vicryl.  We had good hemostasis at this point.  The right ovary was  normal, hemostatically intact, and left in place.  We again irrigated, and  irrigation was removed.  There was no active bleeding, and urine output  remained clear.  At this point in time the O'Connor-O'Sullivan retractor and  packs were removed.  Muscles were reapproximated with running suture of 3-0  Vicryl, some bleeding from the anterior abdominal wall was brought under  control with bipolar and a figure-of-eight of 0 Vicryl.  The fascia was  closed with a running suture of 0 PDS, subcu was closed with running suture  of 3-0 Vicryl, skin was closed with staples.  The suprapubic port was closed  with Dermabond.  Urine output remained clear.  sponge, instrument, and needle count reported as correct by the circulating  nurse x3.  The patient  was taken out of the dorsal lithotomy position and once alert and extubated,  transferred to the recovery room in good condition.  Juluis Mire, M.D.    JSM/MEDQ  D:  04/05/2003  T:  04/05/2003  Job:  [id number]

## 2010-06-26 NOTE — Op Note (Signed)
Paula Fields, Paula Fields                           ACCOUNT NO.:  [id number]   MEDICAL RECORD NO.:  [id number]                   PATIENT TYPE:  INP   LOCATION:  0470                                 FACILITY:  [facility]   PHYSICIAN:  Ollen Gross, M.D.                 DATE OF BIRTH:  [birth year]   DATE OF PROCEDURE:  06/24/2003  DATE OF DISCHARGE:                                 OPERATIVE REPORT   PREOPERATIVE DIAGNOSIS:  Left knee patellofemoral arthritis.   POSTOPERATIVE DIAGNOSIS:  Left knee patellofemoral arthritis.   PROCEDURE:  Left patellofemoral arthroplasty.   SURGEON:  Ollen Gross, M.D.   ASSISTANT:  Alexzandrew L. Julien Girt, P.A.   ANESTHESIA:  General with postoperative Marcaine pain pump.   ESTIMATED BLOOD LOSS:  Minimal.   DRAINS:  Hemovac x1.   TOURNIQUET TIME:  35 minutes at 300 mmHg.   COMPLICATIONS:  None.   CONDITION:  Stable to recovery room.   BRIEF CLINICAL NOTE:  Paula Fields is a 45 year old female who has had multiple  procedures secondary to patellofemoral problems with the left knee.  She has  had arthroscopies which have showed chondral delaminations of the patella.  She does well temporarily after arthroscopic treatment but then develops  progressively disabling pain.  She currently has severe anterior knee pain.  She presents now for patellofemoral arthroplasty of the left knee as she has  had previous success with a procedure on the right knee.   PROCEDURE IN DETAIL:  After successful administration of general anesthetic,  a tourniquet was placed on the left thigh and the left lower extremity was  prepped and draped in the usual sterile fashion.  The extremity was wrapped  in an Esmarch with the knee flexed and the tourniquet inflated to 300 mmHg.  A midline incision was made with a #10 blade through the subcutaneous  tissues to the level of the extensor mechanism.  A fresh blade was used to  make a medial parapatellar arthrotomy.  We then translated the  patella  laterally without everting.  Trochlea was identified and there was no  significant cartilage defect in the trochlea.  I inspected the undersurface  of the patella and essentially the entire mid aspect of the patella  encompassing about 60-70% of the entire cartilage surface of the patella is  delaminated off the bone and of poor substance.  We then translated the  patella once again and placed templates up into the trochlea.  A size small  LCS patellofemoral template is placed.  We outlined it and then debrided the  cartilage all the way down to subchondral bone and contoured this until the  trial fit in perfectly.  There were no elevated edges upon which the  prosthesis would catch.  After we got down to this stage, I then drilled  through the trials to get our three template holes and put the other trial  in once again with excellent fit.  The patella was everted this time and  thickness measured to be 25 mm.  Free hand resection was taken to  approximately 14 mm.  The lug holes were then drilled for the trial of the  small patella and the mobile bearing patella is placed.  It tracked normally  and there was no evidence of any impingement or catching on the two  components.  The components were then removed and hole and surfaces are  prepared with pulsatile lavage.  The cement was mixed for implantation.  A  size small trochlear implant was impacted into the trochlea and the size  small LCS mobile patellofemoral component cemented into the patella.  This  is done with a clamp.  The wound was copiously irrigated and once the cement  was fully hardened, the clamp was released and tracking is assessed.  It  tracked normally without any catching.  The arthrotomy was then closed with  an interrupted #1 PDS.  The subcutaneous closed with interrupted 2-0 Vicryl,  the subcuticular with a running 4-0 Monocryl.  The catheter for the Marcaine  pain pump is placed.  The pump was initiated.   Steri-Strips and a bulky  sterile dressing were applied to her knee and she was placed into an  immobilizer, awake, and transported to recovery in stable condition.                                               Ollen Gross, M.D.    FA/MEDQ  D:  06/24/2003  T:  06/25/2003  Job:  [id number]

## 2010-06-26 NOTE — Op Note (Signed)
[place]  Patient:    Paula Fields                         MRN: [id number] Proc. Date: 11/12/98 Adm. 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  POSTOPERATIVE DIAGNOSES: 1. 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 2. Lateral patellar tracking. 3. Partial posterior horn medial meniscal tear. 4. Chondromalacia medial femoral condyle.  OPERATION: 1. Partial posterior horn medial meniscectomy. 2. Debridement of medial femoral condyle. 3. Debridement of patella by way of chondroplasty. 4. Lateral retinacular release. 5. _____ slide, open.  SURGEON:  Harvie Junior, M.D.  ANESTHESIA:  General.  INDICATIONS:  She is a 45 year old female with a long history of anterior knee pain.  She has undergone an open lateral retinacular release, multiple arthroscopic debridement procedures and persisted with anterior knee pain and because of this a long discussion was undertaken but ultimately we talked to her about coming back to the operating room for debridement of the knee count and _____ slide.  She ultimately came to the operating room for this procedure.  She had multiple second opinions preoperatively.  It was felt that at least debridement was appropriate and that _____ slide was probably indicated.  DESCRIPTION OF PROCEDURE:  The patient was taken to the operating room and after adequate anesthesia was obtained, the patient was placed on the operating room table.  The right leg was then prepped and draped in the usual sterile fashion.  Following this, routine arthroscopic examination of the knee revealed that there  was obvious and significant chondromalacia especially over the medial patellar facet.  This was debrided at least in fragmenting pieces really down to bear bone on the  medial patellar facet.  The lateral patellar facet looked better although some grade II changes.  There was quite significant lateral patellar tracking.  there was some chondromalacia on the lateral femoral condyle.  At this point, attention was turned towards the rest of the knee.  The medial femoral condyle had some grade II changes.  There was a mild tear of the posterior horn of the medial meniscus.  This was debrided minimally and a suction shaver as used to suction this to contour this down.  The anterior cruciate was normal. he lateral side was normal.  Attention was turned back to the patellofemoral joint  where there was noted to be a significant amount of tissue blocking the anterior aspect of the knee.  This was debrided.  The patella was then seen in total and was noted to have quite lateral tracking.  A lateral retinacular release was performed. Interestingly, the open procedure had a contracted scar, and this really was keeping the patella laterally tracking and once this was completely freed up the tendency towards lateral tracking was improved dramatically.  There was difficulty getting the cannula up under the knee cap and at this point after the procedures were undertaken, the knee was suctioned dry and attention was turned anteriorly to doing a _____ slide.  An anterior incision was made.  After Esmarch exsanguination of the extremity, the above pressure tourniquet was inflated to 350 mmHg.  An anterior incision was made from the inferior patellar pole down to about  10 cm distally below the patellar insertion.  Subcutaneous tissue was dissected down o the level of the tibia and the patellar tendon was identified in its both medial  and lateral extent and its insertion to bone.  The tibia was then identified and cut at a 60 degree angle, care being taken to come anteriorly to keep the anterior piece from being so large.  Once this undertaken, this was  carried down distally until the anterior piece became mobile.  Once it was mobile it was anteriorlized 15 mm and medialized probably 1 cm and it was locked in place with two 4.5 mm cortical screws.  Excellent fixation was achieved with both screws. Following this, the wound was copiously irrigated and suctioned dry.  The cannula was put back in to check the tracking and indeed the tracking and centralized some but quite dramatically.  There was really no tendency for the patella to hit the femoral groove until about 45 degrees of flexion.  This, I think was going to solve her anterior knee problem.  Following this, the knee was copiously irrigated and suctioned dry.  The anterior compartment musculature was closed and the subcu was closed with 0 Vicryl and 2-0 Vicryl and the skin with skin stapler.  A large Hemovac drain was put in place prior to wound closure.  Then 0.25% Marcaine was put into the area of the wound for postoperative anesthesia, and the patient was taken to the recovery room where he was noted to be in satisfactory condition.  The estimated blood loss for the procedure was none. DD:  11/12/98 TD:  11/13/98 Job: [id number]

## 2010-09-01 ENCOUNTER — Encounter: Payer: Self-pay | Admitting: Physician Assistant

## 2010-09-02 ENCOUNTER — Encounter: Payer: Self-pay | Admitting: Physician Assistant

## 2010-09-15 ENCOUNTER — Encounter: Payer: Self-pay | Admitting: Physician Assistant

## 2010-09-16 ENCOUNTER — Encounter: Payer: Self-pay | Admitting: Physician Assistant

## 2010-09-24 ENCOUNTER — Emergency Department (HOSPITAL_COMMUNITY): Payer: BC Managed Care – PPO

## 2010-09-24 ENCOUNTER — Emergency Department (HOSPITAL_COMMUNITY)
Admission: EM | Admit: 2010-09-24 | Discharge: 2010-09-24 | Disposition: A | Discharge status: Home / Self Care | Payer: BC Managed Care – PPO | Attending: Emergency Medicine | Admitting: Emergency Medicine

## 2010-09-24 ENCOUNTER — Encounter (HOSPITAL_COMMUNITY): Payer: Self-pay | Admitting: Radiology

## 2010-09-24 DIAGNOSIS — R1031 Right lower quadrant pain: Secondary | ICD-10-CM | POA: Insufficient documentation

## 2010-09-24 DIAGNOSIS — Z9089 Acquired absence of other organs: Secondary | ICD-10-CM | POA: Insufficient documentation

## 2010-09-24 DIAGNOSIS — R110 Nausea: Secondary | ICD-10-CM | POA: Insufficient documentation

## 2010-09-24 DIAGNOSIS — R197 Diarrhea, unspecified: Secondary | ICD-10-CM | POA: Insufficient documentation

## 2010-09-24 LAB — POCT I-STAT, CHEM 8
BUN: 9 mg/dL (ref 6–23)
Chloride: 104 mEq/L (ref 96–112)
Creatinine, Ser: 0.7 mg/dL (ref 0.50–1.10)
Potassium: 3.9 mEq/L (ref 3.5–5.1)
Sodium: 141 mEq/L (ref 135–145)

## 2010-09-24 LAB — CBC
HCT: 41.9 % (ref 36.0–46.0)
Hemoglobin: 13.7 g/dL (ref 12.0–15.0)
MCH: 28.2 pg (ref 26.0–34.0)
MCHC: 32.7 g/dL (ref 30.0–36.0)
RBC: 4.86 MIL/uL (ref 3.87–5.11)

## 2010-09-24 LAB — DIFFERENTIAL
Basophils Relative: 0 % (ref 0–1)
Lymphocytes Relative: 24 % (ref 12–46)
Lymphs Abs: 2.6 10*3/uL (ref 0.7–4.0)
Monocytes Absolute: 0.8 10*3/uL (ref 0.1–1.0)
Monocytes Relative: 8 % (ref 3–12)
Neutro Abs: 7.4 10*3/uL (ref 1.7–7.7)
Neutrophils Relative %: 67 % (ref 43–77)

## 2010-09-24 LAB — URINALYSIS, ROUTINE W REFLEX MICROSCOPIC
Glucose, UA: NEGATIVE mg/dL
Hgb urine dipstick: NEGATIVE
Ketones, ur: NEGATIVE mg/dL
Leukocytes, UA: NEGATIVE
Protein, ur: NEGATIVE mg/dL
pH: 5 (ref 5.0–8.0)

## 2010-09-24 MED ORDER — IOHEXOL 300 MG/ML  SOLN
125.0000 mL | Freq: Once | INTRAMUSCULAR | Status: AC | PRN
  Administered 2010-09-24: 125 mL via INTRAVENOUS

## 2010-09-25 LAB — URINE CULTURE
Colony Count: 4000
Culture  Setup Time: 201208170037

## 2013-05-24 ENCOUNTER — Other Ambulatory Visit: Payer: Self-pay | Admitting: *Deleted

## 2013-05-24 DIAGNOSIS — R109 Unspecified abdominal pain: Secondary | ICD-10-CM

## 2013-05-25 ENCOUNTER — Ambulatory Visit
Admission: RE | Admit: 2013-05-25 | Discharge: 2013-05-25 | Disposition: A | Discharge status: Home / Self Care | Payer: BC Managed Care – PPO | Source: Ambulatory Visit | Attending: *Deleted | Admitting: *Deleted

## 2013-05-25 DIAGNOSIS — R109 Unspecified abdominal pain: Secondary | ICD-10-CM

## 2013-06-01 ENCOUNTER — Other Ambulatory Visit: Payer: Self-pay | Admitting: Family

## 2013-06-01 DIAGNOSIS — R109 Unspecified abdominal pain: Secondary | ICD-10-CM

## 2013-06-06 DIAGNOSIS — M1710 Unilateral primary osteoarthritis, unspecified knee: Secondary | ICD-10-CM | POA: Insufficient documentation

## 2013-06-06 DIAGNOSIS — M179 Osteoarthritis of knee, unspecified: Secondary | ICD-10-CM | POA: Insufficient documentation

## 2013-06-12 ENCOUNTER — Ambulatory Visit
Admission: RE | Admit: 2013-06-12 | Discharge: 2013-06-12 | Disposition: A | Discharge status: Home / Self Care | Payer: BC Managed Care – PPO | Source: Ambulatory Visit | Attending: Family | Admitting: Family

## 2013-06-12 DIAGNOSIS — R109 Unspecified abdominal pain: Secondary | ICD-10-CM

## 2013-06-12 MED ORDER — IOHEXOL 300 MG/ML  SOLN
125.0000 mL | Freq: Once | INTRAMUSCULAR | Status: AC | PRN
  Administered 2013-06-12: 125 mL via INTRAVENOUS

## 2013-07-11 DIAGNOSIS — Z6841 Body Mass Index (BMI) 40.0 and over, adult: Secondary | ICD-10-CM

## 2013-07-11 DIAGNOSIS — E66813 Obesity, class 3: Secondary | ICD-10-CM | POA: Insufficient documentation

## 2013-07-11 DIAGNOSIS — Z713 Dietary counseling and surveillance: Secondary | ICD-10-CM | POA: Insufficient documentation

## 2013-09-04 ENCOUNTER — Ambulatory Visit: Payer: BC Managed Care – PPO | Admitting: Cardiology

## 2013-10-05 DIAGNOSIS — H349 Unspecified retinal vascular occlusion: Secondary | ICD-10-CM | POA: Insufficient documentation

## 2013-10-08 ENCOUNTER — Encounter: Payer: Self-pay | Admitting: Cardiology

## 2013-10-08 ENCOUNTER — Ambulatory Visit
Admission: RE | Admit: 2013-10-08 | Discharge: 2013-10-08 | Disposition: A | Discharge status: Home / Self Care | Payer: BC Managed Care – PPO | Source: Ambulatory Visit | Attending: Cardiology | Admitting: Cardiology

## 2013-10-08 ENCOUNTER — Ambulatory Visit (INDEPENDENT_AMBULATORY_CARE_PROVIDER_SITE_OTHER): Payer: BC Managed Care – PPO | Admitting: Cardiology

## 2013-10-08 VITALS — BP 122/78 | HR 73 | Ht 60.0 in | Wt 245.0 lb

## 2013-10-08 DIAGNOSIS — R0609 Other forms of dyspnea: Secondary | ICD-10-CM

## 2013-10-08 DIAGNOSIS — R079 Chest pain, unspecified: Secondary | ICD-10-CM | POA: Insufficient documentation

## 2013-10-08 DIAGNOSIS — R0989 Other specified symptoms and signs involving the circulatory and respiratory systems: Secondary | ICD-10-CM

## 2013-10-08 NOTE — Patient Instructions (Signed)
Your physician recommends that you continue on your current medications as directed. Please refer to the Current Medication list given to you today.  Your physician has requested that you have a lexiscan myoview. For further information please visit [email or web address] Please follow instruction sheet, as given. 2 day   A chest x-ray takes a picture of the organs and structures inside the chest, including the heart, lungs, and blood vessels. This test can show several things, including, whether the heart is enlarges; whether fluid is building up in the lungs; and whether pacemaker / defibrillator leads are still in place. [facility]  Follow up as needed

## 2013-10-08 NOTE — Progress Notes (Signed)
Paula Fields Date of Birth:  1965/09/27 [facility] [address] [place], [place]  [address] [phone]        Fax   [phone]   History of Present Illness: This pleasant 48 year old woman is seen at the request of Dr. Everlene Other at [facility].  She is seen because of concern over possible ischemic heart disease.  She has been experiencing intermittent chest discomfort with left arm radiation and numbness of the left hand for the past several months.  The episodes are not predictable.  They are not related to exertion.  She will occasionally become diaphoretic.  She has not had any nausea or vomiting with these episodes.  She has been experiencing exertional dyspnea.  She sleeps on one pillow and has not had any paroxysmal nocturnal dyspnea or pedal edema. The patient denies any past history of hypertension except during pregnancies.  She denies any diabetes.  She is a nonsmoker.  She does not consume alcohol or use illicit drugs. She is on medical disability because of knee problems. Her family history reveals that her father died of cancer.  Her mother is living at age 77 and has had coronary artery bypass graft surgery and stents.  She has a 37 year old brother who has been told that he has a weak heart.  No current outpatient prescriptions on file.  The patient takes no medication.    No current facility-administered medications for this visit.    No Known Allergies  Patient Active Problem List   Diagnosis Date Noted  . Chest pain 10/08/2013  . Dyspnea on exertion 10/08/2013    History  Smoking status  . Never Smoker   Smokeless tobacco  . Not on file    History  Alcohol Use No    No family history on file.  Family history positive for coronary disease in her mother who has had bypass surgery  Review of Systems: Constitutional: no fever chills diaphoresis or fatigue or change in weight.  Head and neck: no  hearing loss, no epistaxis, no photophobia or visual disturbance. Respiratory: No cough, shortness of breath or wheezing. Cardiovascular: No chest pain peripheral edema, palpitations. Gastrointestinal: No abdominal distention, no abdominal pain, no change in bowel habits hematochezia or melena. Genitourinary: No dysuria, no frequency, no urgency, no nocturia. Musculoskeletal:No arthralgias, no back pain, no gait disturbance or myalgias. Neurological: No dizziness, no headaches, no numbness, no seizures, no syncope, no weakness, no tremors. Hematologic: No lymphadenopathy, no easy bruising. Psychiatric: No confusion, no hallucinations, no sleep disturbance.    Physical Exam: Filed Vitals:   10/08/13 1056  BP: 122/78  Pulse: 73   this is a large woman in no acute distress.  Head and neck exam reveals that the pupils are equal and reactive.  The extraocular movements are full.  There is no scleral icterus.  Mouth and pharynx are benign.  No lymphadenopathy.  No carotid bruits.  The jugular venous pressure is normal.  Thyroid is not enlarged or tender.  Chest is clear to percussion and auscultation.  No rales or rhonchi.  Expansion of the chest is symmetrical.  Heart reveals no abnormal lift or heave.  First and second heart sounds are normal.  There is no murmur gallop rub or click.  The abdomen is soft and nontender.  Bowel sounds are normoactive.  There is no hepatosplenomegaly or mass.  There are no abdominal bruits.  Extremities reveal no phlebitis or  edema.  Pedal pulses are good.  There is no cyanosis or clubbing.  Neurologic exam is normal strength and no lateralizing weakness.  No sensory deficits.  Integument reveals no rash  EKG shows normal sinus rhythm and is within normal limits at rest  Assessment / Plan: 1. chest pain with left arm radiation, rule out ischemic heart disease 2. dyspnea on exertion 3. osteoarthritis of knees, on medical disability 4. Obesity  Plan:  We will have the patient get a chest x-ray.  She has not had one in many years.  We will have her return for a lexiscan Myoview stress test.  Many thanks for the opportunity to see this pleasant woman.  I will be in touch regarding the results of her studies.

## 2013-10-23 ENCOUNTER — Encounter (HOSPITAL_COMMUNITY): Payer: BC Managed Care – PPO

## 2013-10-24 ENCOUNTER — Telehealth: Payer: Self-pay | Admitting: *Deleted

## 2013-10-24 NOTE — Telephone Encounter (Signed)
DEAL, Paula Fields Rsn: Patient (OUT OF TOWN;STATED WCB TO RESCHED-WHD) Per scheduling desk above, nuclear cancelled.

## 2013-10-24 NOTE — Telephone Encounter (Signed)
Noted  

## 2013-12-06 ENCOUNTER — Telehealth: Payer: Self-pay | Admitting: Cardiology

## 2013-12-06 NOTE — Telephone Encounter (Signed)
New problem    Pt stated she didn't want to have a myocardia perfusion but she wanted to have a GXT. Please advise pt.

## 2013-12-06 NOTE — Telephone Encounter (Signed)
Spoke with patient and clarified insurance would not be billed twice for study  Paula Fallendvised myoview is more informative test that GXT only.  Patient agreeable to proceed with lexiscan myoview.  Will forward to scheduling so that this can be scheduled

## 2013-12-14 ENCOUNTER — Ambulatory Visit (HOSPITAL_COMMUNITY): Payer: BC Managed Care – PPO | Attending: Internal Medicine | Admitting: Radiology

## 2013-12-14 DIAGNOSIS — R61 Generalized hyperhidrosis: Secondary | ICD-10-CM | POA: Insufficient documentation

## 2013-12-14 DIAGNOSIS — R079 Chest pain, unspecified: Secondary | ICD-10-CM | POA: Diagnosis present

## 2013-12-14 DIAGNOSIS — R42 Dizziness and giddiness: Secondary | ICD-10-CM | POA: Insufficient documentation

## 2013-12-14 DIAGNOSIS — R0609 Other forms of dyspnea: Secondary | ICD-10-CM | POA: Diagnosis not present

## 2013-12-14 MED ORDER — TECHNETIUM TC 99M SESTAMIBI GENERIC - CARDIOLITE
33.0000 | Freq: Once | INTRAVENOUS | Status: AC | PRN
  Administered 2013-12-14: 33 via INTRAVENOUS

## 2013-12-14 MED ORDER — REGADENOSON 0.4 MG/5ML IV SOLN
0.4000 mg | Freq: Once | INTRAVENOUS | Status: AC
  Administered 2013-12-14: 0.4 mg via INTRAVENOUS

## 2013-12-14 NOTE — Progress Notes (Signed)
[facility] SITE 3 NUCLEAR MED [address] [place], [place] [id number] [phone]    Cardiology Nuclear Med Study  Paula CornerWendy K Fields is a 48 y.o. female     MRN : [id number]     DOB: 04/25/1965  Procedure Date: 12/14/2013  Nuclear Med Background Indication for Stress Test:  Evaluation for Ischemia History:  no prior cardiac history or testing Cardiac Risk Factors: n/a  Symptoms:  Chest Pain (last date of chest discomfort last night), Diaphoresis, DOE and Light-Headedness   Nuclear Pre-Procedure Caffeine/Decaff Intake:  None NPO After: 10:30pm   Lungs:  clear O2 Sat: 96% on room air. IV 0.9% NS with Angio Cath:  22g  IV Site: R Hand  IV Started by:  Doyne Keelonya Pascal Stiggers, CNMT  Chest Size (in):  50 Cup Size: DD  Height: 5' (1.524 m)  Weight:  247 lb (112.038 kg)  BMI:  Body mass index is 48.24 kg/(m^2). Tech Comments:  n/a    Nuclear Med Study 1 or 2 day study: 2 day  Stress Test Type:  Lexiscan  Reading MD: n/a  Order Authorizing Provider:  Wylene Simmer. Brackbill, MD  Resting Radionuclide: Technetium 781m Sestamibi  Resting Radionuclide Dose: 33.0 mCi  On      12-18-13  Stress Radionuclide:  Technetium 401m Sestamibi  Stress Radionuclide Dose: 33.0 mCi  On         12-14-13          Stress Protocol Rest HR: 64 Stress HR: 101  Rest BP: 135/79 Stress BP: 138/68  Exercise Time (min): n/a METS: n/a           Dose of Adenosine (mg):  n/a Dose of Lexiscan: 0.4 mg  Dose of Atropine (mg): n/a Dose of Dobutamine: n/a mcg/kg/min (at max HR)  Stress Test Technologist: Nelson ChimesSharon Brooks, BS-ES  Nuclear Technologist:  Kerby NoraElzbieta Kubak, CNMT     Rest Procedure:  Myocardial perfusion imaging was performed at rest 45 minutes following the intravenous administration of Technetium 241m Sestamibi. Rest ECG: normal sinus rhythm. No significant abnormalities.  Stress Procedure:  The patient received IV Lexiscan 0.4 mg over 15-seconds.  Technetium 701m Sestamibi injected at 30-seconds.  Quantitative spect  images were obtained after a 45 minute delay.  During the infusion of Lexiscan the patient complained of SOB, feeling flushed, nausea and throat tightness.  These symptoms subsided in recovery. Stress ECG: No significant change from baseline ECG  QPS Raw Data Images:  Normal; no motion artifact; normal heart/lung ratio. Stress Images:  Normal homogeneous uptake in all areas of the myocardium. Rest Images:  Normal homogeneous uptake in all areas of the myocardium. Subtraction (SDS):  No evidence of ischemia. Transient Ischemic Dilatation (Normal <1.22):  1.03 Lung/Heart Ratio (Normal <0.45):  0.29  Quantitative Gated Spect Images QGS EDV:  104 ml QGS ESV:  47 ml  Impression Exercise Capacity:  Lexiscan with no exercise. BP Response:  Normal blood pressure response. Clinical Symptoms:  throat tightness and shortness of breath ECG Impression:  No significant ST segment change suggestive of ischemia. Comparison with Prior Nuclear Study: No images to compare  Overall Impression:  Normal stress nuclear study. There is no scar or ischemia.  LV Ejection Fraction: 55%.  LV Wall Motion:  Normal Wall Motion   Willa RoughJeffrey Katz, MD

## 2013-12-18 ENCOUNTER — Ambulatory Visit (HOSPITAL_COMMUNITY): Payer: BC Managed Care – PPO | Attending: Cardiology

## 2013-12-18 DIAGNOSIS — R0989 Other specified symptoms and signs involving the circulatory and respiratory systems: Secondary | ICD-10-CM

## 2013-12-18 MED ORDER — TECHNETIUM TC 99M SESTAMIBI GENERIC - CARDIOLITE
33.0000 | Freq: Once | INTRAVENOUS | Status: AC | PRN
Start: 2013-12-18 — End: 2013-12-18
  Administered 2013-12-18: 33 via INTRAVENOUS

## 2014-02-25 ENCOUNTER — Emergency Department (HOSPITAL_COMMUNITY): Payer: BLUE CROSS/BLUE SHIELD

## 2014-02-25 ENCOUNTER — Emergency Department (HOSPITAL_COMMUNITY)
Admission: EM | Admit: 2014-02-25 | Discharge: 2014-02-25 | Disposition: A | Discharge status: Home / Self Care | Payer: BLUE CROSS/BLUE SHIELD | Attending: Emergency Medicine | Admitting: Emergency Medicine

## 2014-02-25 ENCOUNTER — Encounter (HOSPITAL_COMMUNITY): Payer: Self-pay | Admitting: Emergency Medicine

## 2014-02-25 DIAGNOSIS — W19XXXA Unspecified fall, initial encounter: Secondary | ICD-10-CM

## 2014-02-25 DIAGNOSIS — W1839XA Other fall on same level, initial encounter: Secondary | ICD-10-CM | POA: Insufficient documentation

## 2014-02-25 DIAGNOSIS — S8991XA Unspecified injury of right lower leg, initial encounter: Secondary | ICD-10-CM | POA: Diagnosis present

## 2014-02-25 DIAGNOSIS — Y9389 Activity, other specified: Secondary | ICD-10-CM | POA: Diagnosis not present

## 2014-02-25 DIAGNOSIS — Z96652 Presence of left artificial knee joint: Secondary | ICD-10-CM | POA: Diagnosis not present

## 2014-02-25 DIAGNOSIS — Y998 Other external cause status: Secondary | ICD-10-CM | POA: Diagnosis not present

## 2014-02-25 DIAGNOSIS — Z8739 Personal history of other diseases of the musculoskeletal system and connective tissue: Secondary | ICD-10-CM | POA: Insufficient documentation

## 2014-02-25 DIAGNOSIS — Y9289 Other specified places as the place of occurrence of the external cause: Secondary | ICD-10-CM | POA: Insufficient documentation

## 2014-02-25 DIAGNOSIS — Z96651 Presence of right artificial knee joint: Secondary | ICD-10-CM | POA: Insufficient documentation

## 2014-02-25 DIAGNOSIS — S8391XA Sprain of unspecified site of right knee, initial encounter: Secondary | ICD-10-CM | POA: Diagnosis not present

## 2014-02-25 MED ORDER — OXYCODONE-ACETAMINOPHEN 5-325 MG PO TABS: 1.0000 | ORAL_TABLET | ORAL | Status: DC | PRN

## 2014-02-25 MED ORDER — ONDANSETRON 4 MG PO TBDP
4.0000 mg | ORAL_TABLET | Freq: Once | ORAL | Status: AC
  Administered 2014-02-25: 4 mg via ORAL
  Filled 2014-02-25: qty 1

## 2014-02-25 MED ORDER — OXYCODONE-ACETAMINOPHEN 5-325 MG PO TABS
1.0000 | ORAL_TABLET | Freq: Once | ORAL | Status: AC
  Administered 2014-02-25: 1 via ORAL
  Filled 2014-02-25: qty 1

## 2014-02-25 NOTE — ED Provider Notes (Signed)
CSN: [id number]     Arrival date & time 02/25/14  1057 History   First MD Initiated Contact with Patient 02/25/14 1106     Chief Complaint  Patient presents with  . Fall  . Knee Pain    right     (Consider location/radiation/quality/duration/timing/severity/associated sxs/prior Treatment) HPI   Paula Fields is a 49 y.o. female complaining of pain to right knee status post slip and fall yesterday. Patient states she slipped on wet ground, her left leg went forward her right knee went backward and the knee impacted the ground. Patient has had 2 knee surgeries by Dr. Antony OdeaLucio in the remote past. Patient is ambulatory but states it's very painful. Over-the-counter pain medications are not helpful, patient denies weakness, numbness, head trauma, cervicalgia, chest pain, abdominal pain.   Past Medical History  Diagnosis Date  . H/O: hysterectomy   . Arthritis   . SOB (shortness of breath)   . Osteoarthritis    Past Surgical History  Procedure Laterality Date  . Appendectomy    . Cesarean section    . Cholecystectomy    . Replacement total knee bilateral    . Shoulder surgery      right   No family history on file. History  Substance Use Topics  . Smoking status: Never Smoker   . Smokeless tobacco: Not on file  . Alcohol Use: No   OB History    No data available     Review of Systems  10 systems reviewed and found to be negative, except as noted in the HPI.   Allergies  Review of patient's allergies indicates no known allergies.  Home Medications   Prior to Admission medications   Not on File   BP 137/52 mmHg  Pulse 62  Temp(Src) 98.3 F (36.8 C) (Oral)  Resp 24  SpO2 95% Physical Exam  Constitutional: She is oriented to person, place, and time. She appears well-developed and well-nourished. No distress.  HENT:  Head: Normocephalic and atraumatic.  Eyes: Conjunctivae and EOM are normal. Pupils are equal, round, and reactive to light.  Cardiovascular: Normal  rate, regular rhythm and intact distal pulses.   Pulmonary/Chest: Effort normal and breath sounds normal. No stridor.  Abdominal: Soft. Bowel sounds are normal. She exhibits no distension.  Musculoskeletal: Normal range of motion. She exhibits tenderness.  Right knee:  Remote surgical scar, No deformity, erythema or abrasions. FROM. Patient is diffusely tender to palpation along the anterior knee, no focal or point tenderness. No effusion or crepitance. Anterior and posterior drawer show no abnormal laxity. Stable to valgus and varus stress. Joint lines are non-tender. Neurovascularly intact. Pt ambulates with non-antalgic gait.    Neurological: She is alert and oriented to person, place, and time.  Psychiatric: She has a normal mood and affect.  Nursing note and vitals reviewed.   ED Course  Procedures (including critical care time) Labs Review Labs Reviewed - No data to display  Imaging Review Dg Knee Complete 4 Views Right  02/25/2014   CLINICAL DATA:  Initial encounter for fall on snow yesterday with anterior right knee pain and limited weight bearing  EXAM: RIGHT KNEE - COMPLETE 4+ VIEW  COMPARISON:  None.  FINDINGS: Four views study shows postsurgical change in the patellofemoral compartment without evidence for hardware complications. Fixation screws in the proximal tibia may be related to prior patellar tendon fixation/repair. No evidence for fracture. No subluxation or dislocation. Mild joint space is seen in the medial compartment with associated  mild spurring. Chondrocalcinosis noted in the lateral joint space.  IMPRESSION: Postoperative changes in the right knee without evidence for fracture or substantial joint effusion.   Electronically Signed   By: Kennith Center M.D.   On: 02/25/2014 11:56     EKG Interpretation None      MDM   Final diagnoses:  Fall    Filed Vitals:   02/25/14 1106  BP: 137/52  Pulse: 62  Temp: 98.3 F (36.8 C)  TempSrc: Oral  Resp: 24  SpO2:  95%    Medications  oxyCODONE-acetaminophen (PERCOCET/ROXICET) 5-325 MG per tablet 1 tablet (1 tablet Oral Given 02/25/14 1121)  ondansetron (ZOFRAN-ODT) disintegrating tablet 4 mg (4 mg Oral Given 02/25/14 1202)    Paula Corner is a pleasant 49 y.o. female presenting with right knee pain status post slip and fall yesterday. Neurovascularly intact with mild tenderness to palpation. X-rays negative. Will treat with rest, ice, compression elevation, NSAIDS. Patient given crutches and an orthopedic follow-up.    Evaluation does not show pathology that would require ongoing emergent intervention or inpatient treatment. Pt is hemodynamically stable and mentating appropriately. Discussed findings and plan with patient/guardian, who agrees with care plan. All questions answered. Return precautions discussed and outpatient follow up given.   Discharge Medication List as of 02/25/2014 12:07 PM    START taking these medications   Details  oxyCODONE-acetaminophen (ROXICET) 5-325 MG per tablet Take 1 tablet by mouth every 4 (four) hours as needed for severe pain., Starting 02/25/2014, Until Discontinued, Print             Wynetta Emery, PA-C 02/25/14 1854  Toy Baker, MD 02/26/14 [phone]

## 2014-02-25 NOTE — Discharge Instructions (Signed)
Rest, Ice intermittently (in the first 24-48 hours), Gentle compression with an Ace wrap, and elevate (Limb above the level of the heart)   Take up to 800mg  of ibuprofen (that is usually 4 over the counter pills)  3 times a day for 5 days. Take with food.  Take percocet for breakthrough pain, do not drink alcohol, drive, care for children or do other critical tasks while taking percocet.   Fall Prevention and Home Safety Falls cause injuries and can affect all age groups. It is possible to prevent falls.  HOW TO PREVENT FALLS  Wear shoes with rubber soles that do not have an opening for your toes.  Keep the inside and outside of your house well lit.  Use night lights throughout your home.  Remove clutter from floors.  Clean up floor spills.  Remove throw rugs or fasten them to the floor with carpet tape.  Do not place electrical cords across pathways.  Put grab bars by your tub, shower, and toilet. Do not use towel bars as grab bars.  Put handrails on both sides of the stairway. Fix loose handrails.  Do not climb on stools or stepladders, if possible.  Do not wax your floors.  Repair uneven or unsafe sidewalks, walkways, or stairs.  Keep items you use a lot within reach.  Be aware of pets.  Keep emergency numbers next to the telephone.  Put smoke detectors in your home and near bedrooms. Ask your doctor what other things you can do to prevent falls. Document Released: 11/21/2008 Document Revised: 07/27/2011 Document Reviewed: 04/27/2011 [facility] Patient Information 2015 [employer] [place]. This information is not intended to replace advice given to you by your health care provider. Make sure you discuss any questions you have with your health care provider.

## 2014-02-25 NOTE — ED Notes (Signed)
Pt states she fell yesterday with knee going behind her, c/o right knee pain, hurts to walks on it.

## 2014-03-21 DIAGNOSIS — R1031 Right lower quadrant pain: Secondary | ICD-10-CM | POA: Insufficient documentation

## 2014-07-24 ENCOUNTER — Encounter: Payer: Self-pay | Admitting: Internal Medicine

## 2014-10-07 ENCOUNTER — Encounter: Payer: BLUE CROSS/BLUE SHIELD | Admitting: Internal Medicine

## 2014-10-16 ENCOUNTER — Encounter: Payer: BLUE CROSS/BLUE SHIELD | Admitting: Internal Medicine

## 2014-10-22 ENCOUNTER — Encounter: Payer: Self-pay | Admitting: Family

## 2014-11-08 ENCOUNTER — Other Ambulatory Visit: Payer: Self-pay | Admitting: Family

## 2014-11-08 ENCOUNTER — Ambulatory Visit
Admission: RE | Admit: 2014-11-08 | Discharge: 2014-11-08 | Disposition: A | Discharge status: Home / Self Care | Payer: BLUE CROSS/BLUE SHIELD | Source: Ambulatory Visit | Attending: Family | Admitting: Family

## 2014-11-08 DIAGNOSIS — M79671 Pain in right foot: Secondary | ICD-10-CM

## 2014-12-19 DIAGNOSIS — J189 Pneumonia, unspecified organism: Secondary | ICD-10-CM | POA: Insufficient documentation

## 2016-07-06 ENCOUNTER — Other Ambulatory Visit: Payer: Self-pay | Admitting: Family

## 2016-07-06 DIAGNOSIS — Z1231 Encounter for screening mammogram for malignant neoplasm of breast: Secondary | ICD-10-CM

## 2016-07-12 ENCOUNTER — Ambulatory Visit
Admission: RE | Admit: 2016-07-12 | Discharge: 2016-07-12 | Disposition: A | Discharge status: Home / Self Care | Payer: BLUE CROSS/BLUE SHIELD | Source: Ambulatory Visit | Attending: Family | Admitting: Family

## 2016-07-12 DIAGNOSIS — Z1231 Encounter for screening mammogram for malignant neoplasm of breast: Secondary | ICD-10-CM

## 2017-05-26 ENCOUNTER — Ambulatory Visit: Payer: BLUE CROSS/BLUE SHIELD | Admitting: Podiatry

## 2017-05-26 ENCOUNTER — Encounter: Payer: Self-pay | Admitting: Podiatry

## 2017-05-26 ENCOUNTER — Ambulatory Visit (INDEPENDENT_AMBULATORY_CARE_PROVIDER_SITE_OTHER): Payer: BLUE CROSS/BLUE SHIELD

## 2017-05-26 DIAGNOSIS — M722 Plantar fascial fibromatosis: Secondary | ICD-10-CM

## 2017-05-26 MED ORDER — TRIAMCINOLONE ACETONIDE 10 MG/ML IJ SUSP: 10.0000 mg | Freq: Once | INTRAMUSCULAR | Status: DC

## 2017-05-26 MED ORDER — TRIAMCINOLONE ACETONIDE 10 MG/ML IJ SUSP
10.0000 mg | Freq: Once | INTRAMUSCULAR | Status: AC
  Administered 2017-05-26: 10 mg

## 2017-05-26 MED ORDER — DICLOFENAC SODIUM 75 MG PO TBEC: 75.0000 mg | DELAYED_RELEASE_TABLET | Freq: Two times a day (BID) | ORAL | 2 refills | Status: DC

## 2017-05-26 NOTE — Patient Instructions (Addendum)

## 2017-05-26 NOTE — Progress Notes (Signed)
Subjective:   Patient ID: Paula Fields, female   DOB: 52 y.o.   MRN: [id number]   HPI Patient presents with significant pain plantar aspect right heel at the insertional point tendon into the calcaneus.  Patient states is been going on for around 8 months and she is tried different treatment options without relief and has tried shoe gear modifications.  Patient does not smoke likes to be active   Review of Systems  All other systems reviewed and are negative.       Objective:  Physical Exam  Constitutional: She appears well-developed and well-nourished.  Cardiovascular: Intact distal pulses.  Pulmonary/Chest: Effort normal.  Musculoskeletal: Normal range of motion.  Neurological: She is alert.  Skin: Skin is warm.  Nursing note and vitals reviewed.   Neurovascular status intact muscle strength is adequate range of motion within normal limits with patient found to have exquisite discomfort plantar aspect heel right at the insertional point of the tendon into the calcaneus with fluid buildup noted around the medial band.  Patient is noted to have good digital perfusion and is well oriented x3    Assessment:  Acute plantar fasciitis right     Plan:  H&P x-ray reviewed and today I injected the right plantar fascia 3 mg Kenalog 5 mg Xylocaine applied fascial brace and gave instructions for physical therapy.  Reappoint for us to recheck again in the next several weeks  X-ray indicates spur formation with no indications of stress fracture or advanced arthritis

## 2017-06-09 ENCOUNTER — Encounter: Payer: Self-pay | Admitting: Podiatry

## 2017-06-09 ENCOUNTER — Ambulatory Visit: Payer: BLUE CROSS/BLUE SHIELD | Admitting: Podiatry

## 2017-06-09 DIAGNOSIS — M722 Plantar fascial fibromatosis: Secondary | ICD-10-CM

## 2017-06-09 NOTE — Patient Instructions (Signed)
Pre-Operative Instructions  Congratulations, you have decided to take an important step towards improving your quality of life.  You can be assured that the doctors and staff at [facility] will be with you every step of the way.  Here are some important things you should know:  1. Plan to be at the surgery center/hospital at least 1 (one) hour prior to your scheduled time, unless otherwise directed by the surgical center/hospital staff.  You must have a responsible adult accompany you, remain during the surgery and drive you home.  Make sure you have directions to the surgical center/hospital to ensure you arrive on time. 2. If you are having surgery at [facility] or [facility], you will need a copy of your medical history and physical form from your family physician within one month prior to the date of surgery. We will give you a form for your primary physician to complete.  3. We make every effort to accommodate the date you request for surgery.  However, there are times where surgery dates or times have to be moved.  We will contact you as soon as possible if a change in schedule is required.   4. No aspirin/ibuprofen for one week before surgery.  If you are on aspirin, any non-steroidal anti-inflammatory medications (Mobic, Aleve, Ibuprofen) should not be taken seven (7) days prior to your surgery.  You make take Tylenol for pain prior to surgery.  5. Medications - If you are taking daily heart and blood pressure medications, seizure, reflux, allergy, asthma, anxiety, pain or diabetes medications, make sure you notify the surgery center/hospital before the day of surgery so they can tell you which medications you should take or avoid the day of surgery. 6. No food or drink after midnight the night before surgery unless directed otherwise by surgical center/hospital staff. 7. No alcoholic beverages 24-hours prior to surgery.  No smoking 24-hours prior or 24-hours after  surgery. 8. Wear loose pants or shorts. They should be loose enough to fit over bandages, boots, and casts. 9. Don't wear slip-on shoes. Sneakers are preferred. 10. Bring your boot with you to the surgery center/hospital.  Also bring crutches or a walker if your physician has prescribed it for you.  If you do not have this equipment, it will be provided for you after surgery. 11. If you have not been contacted by the surgery center/hospital by the day before your surgery, call to confirm the date and time of your surgery. 12. Leave-time from work may vary depending on the type of surgery you have.  Appropriate arrangements should be made prior to surgery with your employer. 13. Prescriptions will be provided immediately following surgery by your doctor.  Fill these as soon as possible after surgery and take the medication as directed. Pain medications will not be refilled on weekends and must be approved by the doctor. 14. Remove nail polish on the operative foot and avoid getting pedicures prior to surgery. 15. Wash the night before surgery.  The night before surgery wash the foot and leg well with water and the antibacterial soap provided. Be sure to pay special attention to beneath the toenails and in between the toes.  Wash for at least three (3) minutes. Rinse thoroughly with water and dry well with a towel.  Perform this wash unless told not to do so by your physician.  Enclosed: 1 Ice pack (please put in freezer the night before surgery)   1 Hibiclens skin cleaner     Pre-op instructions  If you have any questions regarding the instructions, please do not hesitate to call our office.  [place]: [address], [place], [place] [address] -- [phone]  [place]: [address] [place], [place] [address] -- [phone]  [place]: [address]  [place], [place] [address] -- [phone]  [place]: [address], [address], [place],  [address] -- [phone]  Website:  [email or web address] 

## 2017-06-09 NOTE — Progress Notes (Signed)
Subjective:   Patient ID: Paula Fields, female   DOB: 52 y.o.   MRN: [id number]   HPI Patient presents stating this pain is awful in my heel and it did not respond to the injection and I have tried numerous things at home and warm inserts without relief and I need something done due to the pain   ROS      Objective:  Physical Exam  Neurovascular status intact with intense discomfort plantar fascia right at the insertional point of the tendon into the calcaneus     Assessment:  Chronic plantar fasciitis right with pain     Plan:  Reviewed condition at great length the fact it has been almost a year and the fact she has not been able to do any form of activity.  Patient needs to lose weight but cannot walk on her foot due to the pain in the heel and at this point due to the amount of pain she is and and its failure to respond to injection with no change I have recommended surgical endoscopic procedure.  Patient wants this done and I allowed her to review a consent form going over alternative treatments complications.  Patient understands recovery can take 6 months to 1 year and there is no long-term guarantees arch pain or other pains may occur with this procedure.  Patient wants surgery signed consent forms given all preoperative instructions and had a air fracture walker dispensed for the postoperative period which I want her to get used to prior to procedure

## 2017-06-28 ENCOUNTER — Encounter: Payer: Self-pay | Admitting: Podiatry

## 2017-06-28 DIAGNOSIS — M722 Plantar fascial fibromatosis: Secondary | ICD-10-CM

## 2017-06-30 ENCOUNTER — Telehealth: Payer: Self-pay | Admitting: *Deleted

## 2017-06-30 NOTE — Telephone Encounter (Signed)
Unable to leave a message pt's home phone rang over 1 minute without answering service.

## 2017-06-30 NOTE — Telephone Encounter (Signed)
Pt called twice within 2 minutes asking when she could walk on the surgery foot and if she had to sleep in the surgery boot.

## 2017-06-30 NOTE — Telephone Encounter (Signed)
I informed pt she could only be up on the surgery foot 15 minutes/hour, to remain in the boot at all times and ice the area 15 minutes/hour by placing cloth covered ice pack behind the knee. Pt states understanding.

## 2017-07-06 ENCOUNTER — Ambulatory Visit (INDEPENDENT_AMBULATORY_CARE_PROVIDER_SITE_OTHER): Payer: BLUE CROSS/BLUE SHIELD | Admitting: Podiatry

## 2017-07-06 VITALS — BP 123/80 | HR 85 | Temp 96.5°F

## 2017-07-06 DIAGNOSIS — M722 Plantar fascial fibromatosis: Secondary | ICD-10-CM

## 2017-07-06 NOTE — Progress Notes (Signed)
Subjective:   Patient ID: Paula Fields, female   DOB: 52 y.o.   MRN: [id number]   HPI Patient states doing real well with minimal discomfort and only pain at times in the evening   ROS      Objective:  Physical Exam  Neurovascular status intact negative Homans sign noted with wound edges well coapted medial lateral side of the right heel     Assessment:  Doing well post endoscopic surgery right plantar fascia     Plan:  Advised this patient on continued compression elevation and immobilization and dispensed surgical shoe continue with boot for most the time.  Reappoint to recheck

## 2017-07-20 ENCOUNTER — Encounter: Payer: Self-pay | Admitting: Podiatry

## 2017-07-20 ENCOUNTER — Ambulatory Visit (INDEPENDENT_AMBULATORY_CARE_PROVIDER_SITE_OTHER): Payer: BLUE CROSS/BLUE SHIELD | Admitting: Podiatry

## 2017-07-20 DIAGNOSIS — M722 Plantar fascial fibromatosis: Secondary | ICD-10-CM | POA: Diagnosis not present

## 2017-07-20 NOTE — Progress Notes (Signed)
Subjective:   Patient ID: Paula Fields, female   DOB: 52 y.o.   MRN: [id number]   HPI Patient presents stating doing well with surgery and very satisfied   ROS      Objective:  Physical Exam  Neurovascular status intact stitches in place bilateral minimal plantar discomfort noted     Assessment:  Doing well post endoscopic surgery right     Plan:  Advised on continuation of conservative care and stitches removed and compression dressing applied continuation of boot anti-inflammatories and reappoint to recheck 4 weeks or earlier if needed

## 2017-08-15 ENCOUNTER — Other Ambulatory Visit: Payer: Self-pay | Admitting: Family

## 2017-08-15 ENCOUNTER — Other Ambulatory Visit: Payer: Self-pay | Admitting: Family Medicine

## 2017-08-15 DIAGNOSIS — Z1231 Encounter for screening mammogram for malignant neoplasm of breast: Secondary | ICD-10-CM

## 2017-09-02 ENCOUNTER — Ambulatory Visit
Admission: RE | Admit: 2017-09-02 | Discharge: 2017-09-02 | Disposition: A | Discharge status: Home / Self Care | Payer: BLUE CROSS/BLUE SHIELD | Source: Ambulatory Visit | Attending: Family Medicine | Admitting: Family Medicine

## 2017-09-02 DIAGNOSIS — Z1231 Encounter for screening mammogram for malignant neoplasm of breast: Secondary | ICD-10-CM

## 2017-11-22 ENCOUNTER — Encounter: Payer: Self-pay | Admitting: Allergy & Immunology

## 2017-11-22 ENCOUNTER — Ambulatory Visit: Payer: BLUE CROSS/BLUE SHIELD | Admitting: Allergy & Immunology

## 2017-11-22 VITALS — BP 122/70 | HR 62 | Temp 98.1°F | Resp 16 | Ht 64.0 in | Wt 243.8 lb

## 2017-11-22 DIAGNOSIS — J310 Chronic rhinitis: Secondary | ICD-10-CM | POA: Diagnosis not present

## 2017-11-22 DIAGNOSIS — L508 Other urticaria: Secondary | ICD-10-CM | POA: Diagnosis not present

## 2017-11-22 DIAGNOSIS — J302 Other seasonal allergic rhinitis: Secondary | ICD-10-CM

## 2017-11-22 DIAGNOSIS — J3089 Other allergic rhinitis: Secondary | ICD-10-CM | POA: Diagnosis not present

## 2017-11-22 MED ORDER — CETIRIZINE HCL 10 MG PO TABS: 10.0000 mg | ORAL_TABLET | Freq: Every day | ORAL | 5 refills | Status: DC

## 2017-11-22 NOTE — Progress Notes (Signed)
NEW PATIENT  Date of Service/Encounter:  11/22/17  Referring provider: Tracey Harries, MD   Assessment:   Chronic urticaria   Chronic rhinitis  Plan/Recommendations:   1. Chronic urticaria - We are not going to do a lab workup at this time since the rash almost sounds like a contact dermatitis (such as poison sumac). - Try to find the pictures and email them to me: [email or web address]  2. Chronic rhinitis - Testing today showed: trees, grasses, indoor molds, outdoor molds and cat - Avoidance measures provided. - Start taking: Zyrtec (cetirizine) 10mg  tablet once daily - You can use an extra dose of the antihistamine, if needed, for breakthrough symptoms.  - Consider nasal saline rinses 1-2 times daily to remove allergens from the nasal cavities as well as help with mucous clearance (this is especially helpful to do before the nasal sprays are given)  3. Return in about 2 months (around 01/22/2018).  Subjective:   Paula Fields is a 52 y.o. female presenting today for evaluation of  Chief Complaint  Patient presents with  . Urticaria    Paula Fields has a history of the following: Patient Active Problem List   Diagnosis Date Noted  . Pneumonia due to infectious organism 12/19/2014  . RLQ abdominal pain 03/21/2014  . Chest pain 10/08/2013  . Dyspnea on exertion 10/08/2013  . Retinal vascular occlusion 10/05/2013  . Morbid obesity with BMI of 40.0-44.9, adult (HCC) 07/11/2013  . Weight loss counseling, encounter for 07/11/2013  . Osteoarthrosis of knee 06/06/2013    History obtained from: chart review and patient.  Paula Fields was referred by Tracey Harries, MD.     Paula Fields is a 52 y.o. female presenting for an evaluation of hives.  She reports that she developed hives around 1 month ago.  Unfortunately, she does not have pictures of these.  However, she reports that she broke out on her arms and then it spread to the remainder of her body.  Eventually,  she actually describes what sound like blisters.  In any case, she was given a topical steroid that seemed to help.  It took around 3 weeks to clear completely.  They did not leave any residual lesions.  These are not associated with arthritis at all.  Interesting, she does endorse left arm tingling and numbness that occurs transiently since the rash appeared.  She denies any systemic symptoms including shortness of breath, throat swelling, stomach pain, or chest tightness.  She is unsure what triggered this.  She has a very particular diet as she is somewhat picky, but she does not think her rash was secondary to any particular food.  She tolerates or avoids all of the major food allergens without adverse event.  The ones that she avoids are only done so because of taste.  She does have a history of headaches as well.  These seem to start when they adopted their now 70-month-old chocolate lab around 92 weeks of age.  She reports that the headaches are only around when she is outside with the dog.  The dog thankfully remains outdoors.  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  She has not tried using any antihistamines to see if it makes it feel better.  As a child, she did have a multitude of allergens.  She was never started on allergy shots.  She does endorse some intermittent sinus issues.  This is less than 1 time per year.  She does receive prednisone during  these episodes.  She has no history of asthma.  She denies coughing as well as wheezing on a routine basis.  She has never needed an inhaler.  She does endorse what sounds like a wheezing sound that starts in her nasal cavity.  She has never needed prednisone for breathing and has never needed to go to the ER.  Otherwise, there is no history of other atopic diseases, including asthma, food allergies, drug allergies or stinging insect allergies. There is no significant infectious history. Vaccinations are up to date.    Past Medical History: Patient  Active Problem List   Diagnosis Date Noted  . Pneumonia due to infectious organism 12/19/2014  . RLQ abdominal pain 03/21/2014  . Chest pain 10/08/2013  . Dyspnea on exertion 10/08/2013  . Retinal vascular occlusion 10/05/2013  . Morbid obesity with BMI of 40.0-44.9, adult (HCC) 07/11/2013  . Weight loss counseling, encounter for 07/11/2013  . Osteoarthrosis of knee 06/06/2013    Medication List:  Allergies as of 11/22/2017   No Known Allergies     Medication List        Accurate as of 11/22/17  4:43 PM. Always use your most recent med list.          cetirizine 10 MG tablet Commonly known as:  ZYRTEC Take 1 tablet (10 mg total) by mouth daily.   naproxen sodium 220 MG tablet Commonly known as:  ALEVE Take by mouth.       Birth History: non-contributory  Developmental History: non-contributory.   Past Surgical History: Past Surgical History:  Procedure Laterality Date  . APPENDECTOMY    . CESAREAN SECTION    . CHOLECYSTECTOMY    . REPLACEMENT TOTAL KNEE BILATERAL    . SHOULDER SURGERY     right     Family History: Family History  Problem Relation Age of Onset  . Heart disease Mother   . Anuerysm Brother   . Breast cancer Neg Hx      Social History: Paula Fields lives at home with her husband.  They live in a house that was built in [address].  There is wood in the main living areas.  They have gas heating and window units for cooling.  There is 1 dog outside of the home.  There is no tobacco exposure.  She does have a dust mite cover on her bed, but not her pillows. 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     Review of Systems: a 14-point review of systems is pertinent for what is mentioned in HPI.  Otherwise, all other systems were negative. Constitutional: negative other than that listed in the HPI Eyes: negative other than that listed in the HPI Ears, nose, mouth, throat, and face: negative other than that  listed in the HPI Respiratory: negative other than that listed in the HPI Cardiovascular: negative other than that listed in the HPI Gastrointestinal: negative other than that listed in the HPI Genitourinary: negative other than that listed in the HPI Integument: negative other than that listed in the HPI Hematologic: negative other than that listed in the HPI Musculoskeletal: negative other than that listed in the HPI Neurological: negative other than that listed in the HPI Allergy/Immunologic: negative other than that listed in the HPI    Objective:   Blood pressure 122/70, pulse 62, temperature 98.1 F (36.7 C), temperature source Oral, resp. rate 16, height 5\' 4"  (1.626 m), weight 243 lb 12.8 oz (110.6 kg),  SpO2 98 %. Body mass index is 41.85 kg/m.   Physical Exam:  General: Alert, interactive, in no acute distress.  Pleasant obese female. Eyes: No conjunctival injection bilaterally, no discharge on the right, no discharge on the left and no Horner-Trantas dots present. PERRL bilaterally. EOMI without pain. No photophobia.  Ears: Right TM pearly gray with normal light reflex, Left TM pearly gray with normal light reflex, Right TM intact without perforation and Left TM intact without perforation.  Nose/Throat: External nose within normal limits and septum midline. Turbinates edematous with clear discharge. Posterior oropharynx erythematous without cobblestoning in the posterior oropharynx. Tonsils 2+ without exudates.  Tongue without thrush. Neck: Supple without thyromegaly. Trachea midline. Adenopathy: no enlarged lymph nodes appreciated in the anterior cervical, occipital, axillary, epitrochlear, inguinal, or popliteal regions. Lungs: Clear to auscultation without wheezing, rhonchi or rales. No increased work of breathing. CV: Normal S1/S2. No murmurs. Capillary refill <2 seconds.  Abdomen: Nondistended, nontender. No guarding or rebound tenderness. Bowel sounds present in all  fields and hypoactive  Skin: Warm and dry, without lesions or rashes. Sun damage present. Extremities:  No clubbing, cyanosis or edema. Neuro:   Grossly intact. No focal deficits appreciated. Responsive to questions.  Diagnostic studies:   Allergy Studies:   Airborne Adult Perc - December 14, 2017 1359    Time Antigen Placed  1359    Allergen Manufacturer  Waynette Buttery    Location  Back    Number of Test  59    Panel 1  Select    1. Control-Buffer 50% Glycerol  Negative    2. Control-Histamine 1 mg/ml  2+    3. Albumin saline  Negative    4. Bahia  Negative    5. [place]  Negative    6. Johnson  Negative    7. Kentucky Blue  Negative    8. Meadow Fescue  Negative    9. Perennial Rye  Negative    10. Sweet Vernal  Negative    11. Timothy  Negative    12. Cocklebur  Negative    13. Burweed Marshelder  Negative    14. Ragweed, short  Negative    15. Ragweed, Giant  Negative    16. Plantain,  English  Negative    17. Lamb's Quarters  Negative    18. Sheep Sorrell  Negative    19. Rough Pigweed  Negative    20. Marsh Elder, Rough  Negative    21. Mugwort, Common  Negative    22. Ash mix  Negative    23. Birch mix  Negative    24. Beech American  Negative    25. Box, Elder  Negative    26. Cedar, red  Negative    27. Cottonwood, Guinea-Bissau  Negative    28. Elm mix  Negative    29. Hickory mix  Negative    30. Maple mix  Negative    31. Oak, Guinea-Bissau mix  Negative    32. Pecan Pollen  Negative    33. Pine mix  Negative    34. Sycamore Eastern  Negative    35. Walnut, Black Pollen  Negative    36. Alternaria alternata  Negative    37. Cladosporium Herbarum  Negative    38. Aspergillus mix  Negative    39. Penicillium mix  Negative    40. Bipolaris sorokiniana (Helminthosporium)  Negative    41. Drechslera spicifera (Curvularia)  Negative    42. Mucor plumbeus  Negative  43. Fusarium moniliforme  Negative    44. Aureobasidium pullulans (pullulara)  Negative    45. Rhizopus oryzae   Negative    46. Botrytis cinera  Negative    47. Epicoccum nigrum  Negative    48. Phoma betae  Negative    49. Candida Albicans  Negative    50. Trichophyton mentagrophytes  Negative    51. Mite, D Farinae  5,000 AU/ml  Negative    52. Mite, D Pteronyssinus  5,000 AU/ml  Negative    53. Cat Hair 10,000 BAU/ml  Negative    54.  Dog Epithelia  Negative    55. Mixed Feathers  Negative    56. Horse Epithelia  Negative    57. Cockroach, German  Negative    58. Mouse  Negative    59. Tobacco Leaf  Negative     Intradermal - 11/22/17 1428    Time Antigen Placed  1428    Location  Arm    Number of Test  15    Intradermal  Select    Control  Negative    [place]  1+    Johnson  1+    7 Grass  1+    Ragweed mix  Negative    Weed mix  Negative    Tree mix  1+    Mold 1  2+    Mold 2  2+    Mold 3  Negative    Mold 4  2+    Cat  1+    Dog  Negative    Cockroach  Negative    Mite mix  Negative        Allergy testing results were read and interpreted by myself, documented by clinical staff.       Malachi Bonds, MD [facility]

## 2017-11-22 NOTE — Patient Instructions (Addendum)
1. Chronic urticaria - We are not going to do a lab workup at this time since the rash almost sounds like a contact dermatitis (such as poison sumac). - Try to find the pictures and email them to me: [name]  2. Chronic rhinitis - Testing today showed: trees, grasses, indoor molds, outdoor molds and cat - Avoidance measures provided. - Start taking: Zyrtec (cetirizine) 10mg  tablet once daily - You can use an extra dose of the antihistamine, if needed, for breakthrough symptoms.  - Consider nasal saline rinses 1-2 times daily to remove allergens from the nasal cavities as well as help with mucous clearance (this is especially helpful to do before the nasal sprays are given)  3. Return in about 2 months (around 01/22/2018).   Please inform us of any Emergency Department visits, hospitalizations, or changes in symptoms. Call us before going to the ED for breathing or allergy symptoms since we might be able to fit you in for a sick visit. Feel free to contact us anytime with any questions, problems, or concerns.  It was a pleasure to see you again today!  Websites that have reliable patient information: 1. American Academy of Asthma, Allergy, and Immunology: www.aaaai.org 2. Food Allergy Research and Education (FARE): foodallergy.org 3. Mothers of Asthmatics: http://www.asthmacommunitynetwork.org 4. American College of Allergy, Asthma, and Immunology: MissingWeapons.ca   Make sure you are registered to vote! If you have moved or changed any of your contact information, you will need to get this updated before voting!      Reducing Pollen Exposure  The American Academy of Allergy, Asthma and Immunology suggests the following steps to reduce your exposure to pollen during allergy seasons.    1. Do not hang sheets or clothing out to dry; pollen may collect on these items. 2. Do not mow lawns or spend time around freshly cut grass; mowing stirs up pollen. 3. Keep windows  closed at night.  Keep car windows closed while driving. 4. Minimize morning activities outdoors, a time when pollen counts are usually at their highest. 5. Stay indoors as much as possible when pollen counts or humidity is high and on windy days when pollen tends to remain in the air longer. 6. Use air conditioning when possible.  Many air conditioners have filters that trap the pollen spores. 7. Use a HEPA room air filter to remove pollen form the indoor air you breathe.  Control of Mold Allergen   Mold and fungi can grow on a variety of surfaces provided certain temperature and moisture conditions exist.  Outdoor molds grow on plants, decaying vegetation and soil.  The major outdoor mold, Alternaria and Cladosporium, are found in very high numbers during hot and dry conditions.  Generally, a late Summer - Fall peak is seen for common outdoor fungal spores.  Rain will temporarily lower outdoor mold spore count, but counts rise rapidly when the rainy period ends.  The most important indoor molds are Aspergillus and Penicillium.  Dark, humid and poorly ventilated basements are ideal sites for mold growth.  The next most common sites of mold growth are the bathroom and the kitchen.  Outdoor (Seasonal) Mold Control  Positive outdoor molds via skin testing: Alternaria and Cladosporium  1. Use air conditioning and keep windows closed 2. Avoid exposure to decaying vegetation. 3. Avoid leaf raking. 4. Avoid grain handling. 5. Consider wearing a face mask if working in moldy areas.  6.   Indoor (Perennial) Mold Control   Positive indoor molds via skin  testing: Aspergillus, Penicillium, Fusarium, Aureobasidium (Pullulara) and Rhizopus  1. Maintain humidity below 50%. 2. Clean washable surfaces with 5% bleach solution. 3. Remove sources e.g. contaminated carpets.     Control of Dog or Cat Allergen  Avoidance is the best way to manage a dog or cat allergy. If you have a dog or cat and are  allergic to dog or cats, consider removing the dog or cat from the home. If you have a dog or cat but don't want to find it a new home, or if your family wants a pet even though someone in the household is allergic, here are some strategies that may help keep symptoms at bay:  1. Keep the pet out of your bedroom and restrict it to only a few rooms. Be advised that keeping the dog or cat in only one room will not limit the allergens to that room. 2. Don't pet, hug or kiss the dog or cat; if you do, wash your hands with soap and water. 3. High-efficiency particulate air (HEPA) cleaners run continuously in a bedroom or living room can reduce allergen levels over time. 4. Regular use of a high-efficiency vacuum cleaner or a central vacuum can reduce allergen levels. 5. Giving your dog or cat a bath at least once a week can reduce airborne allergen.

## 2017-11-23 DIAGNOSIS — J302 Other seasonal allergic rhinitis: Secondary | ICD-10-CM | POA: Insufficient documentation

## 2017-11-23 DIAGNOSIS — L508 Other urticaria: Secondary | ICD-10-CM | POA: Insufficient documentation

## 2017-11-23 DIAGNOSIS — J3089 Other allergic rhinitis: Secondary | ICD-10-CM

## 2017-11-29 ENCOUNTER — Ambulatory Visit: Payer: BLUE CROSS/BLUE SHIELD | Admitting: Allergy & Immunology

## 2018-01-24 ENCOUNTER — Ambulatory Visit: Payer: BLUE CROSS/BLUE SHIELD | Admitting: Allergy & Immunology

## 2018-01-24 DIAGNOSIS — J309 Allergic rhinitis, unspecified: Secondary | ICD-10-CM

## 2019-04-08 IMAGING — MG DIGITAL SCREENING BILATERAL MAMMOGRAM WITH TOMO AND CAD
6 of 10 series · 6 of 30 positions shown · non-contrast
Comparison: Previous exam(s).

ACR Breast Density Category a: The breast tissue is almost entirely
fatty.

CLINICAL DATA: Screening.

EXAM:
DIGITAL SCREENING BILATERAL MAMMOGRAM WITH TOMO AND CAD

[R MLO synth-2D]
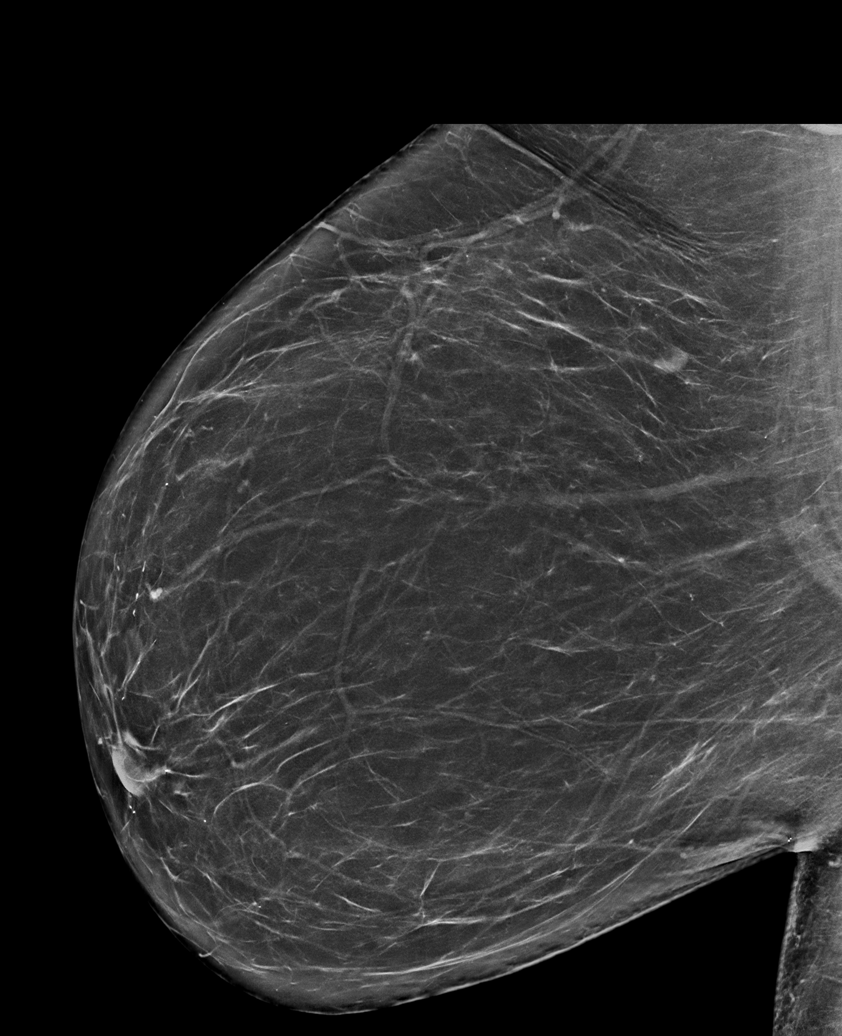

[L MLO synth-2D (1 of 2)]
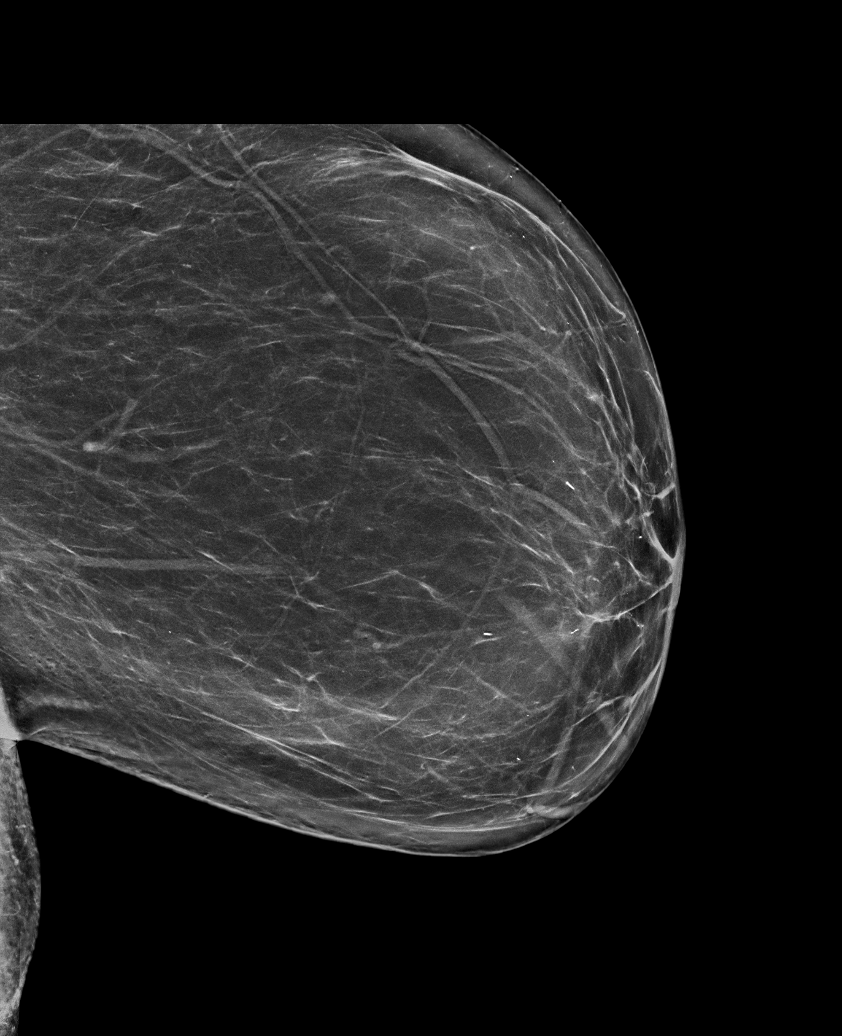

[L CC synth-2D]
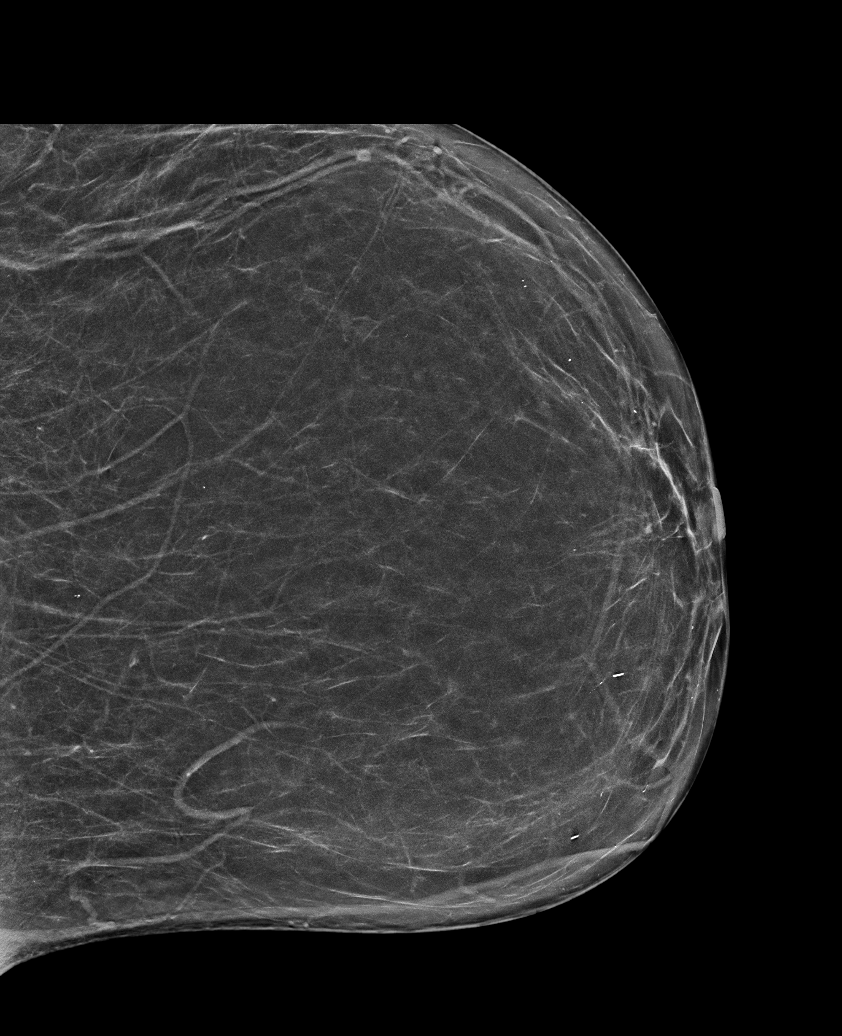

[L MLO synth-2D (2 of 2)]
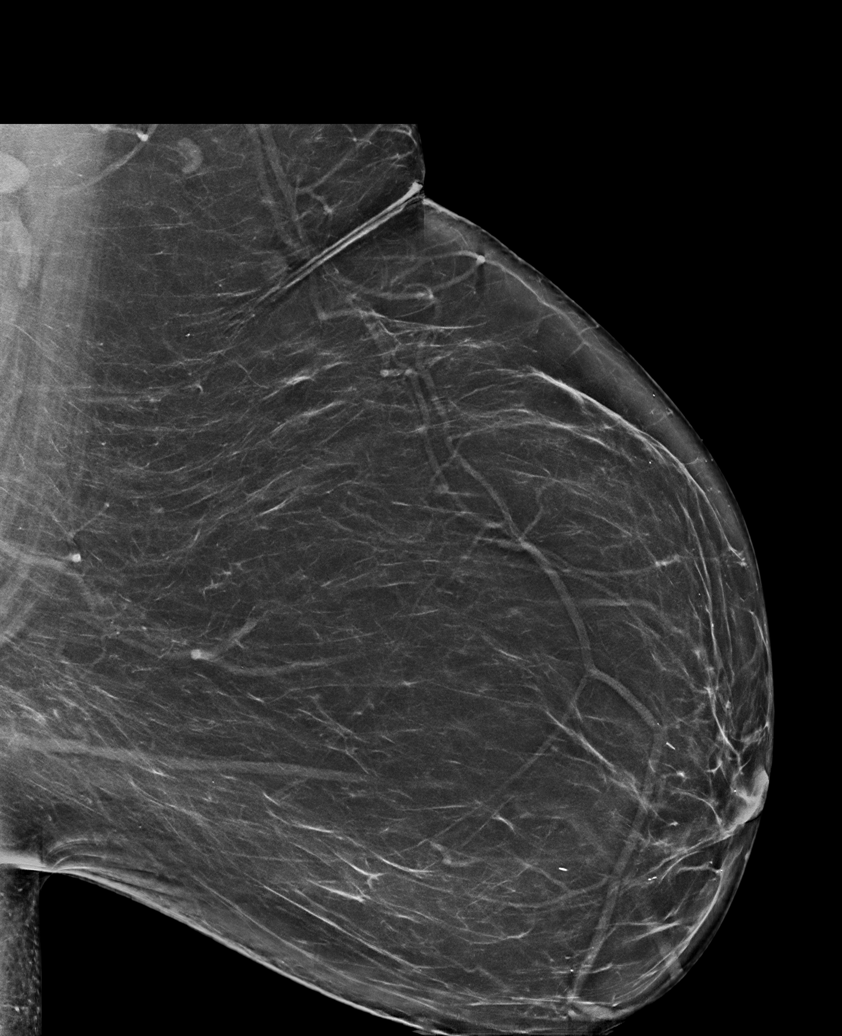

[R CC synth-2D]
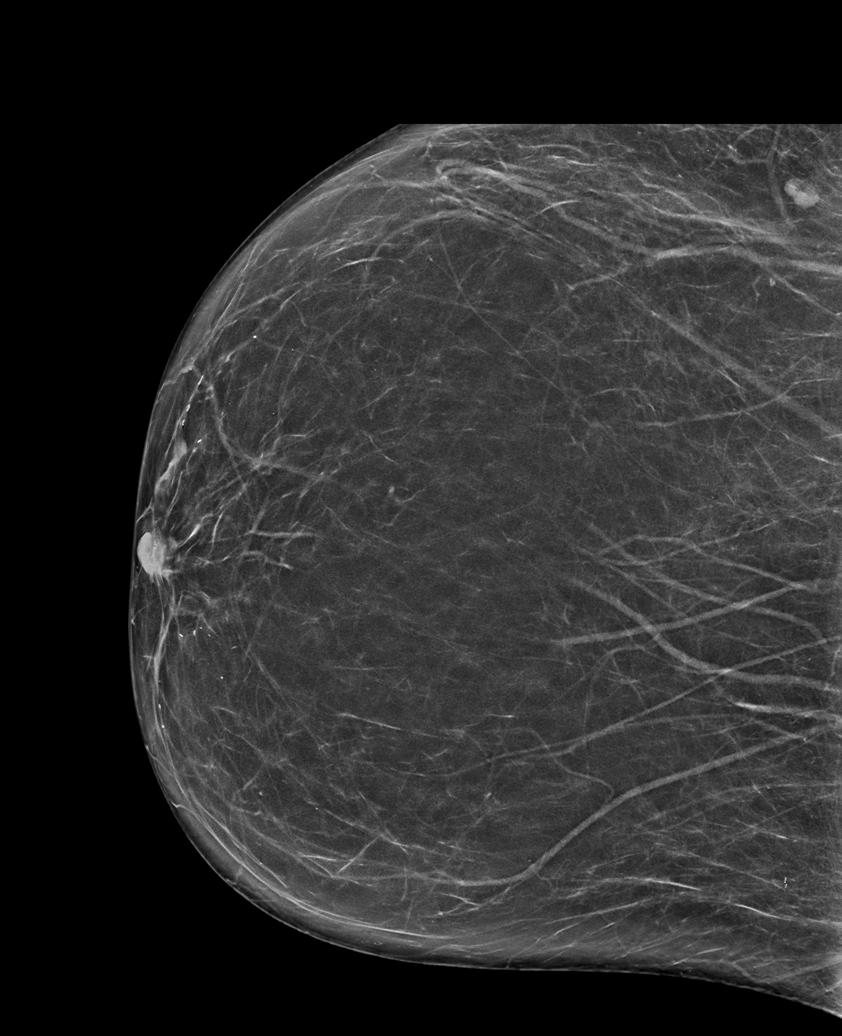

[L CC tomo · tomo slice 37/72.0]
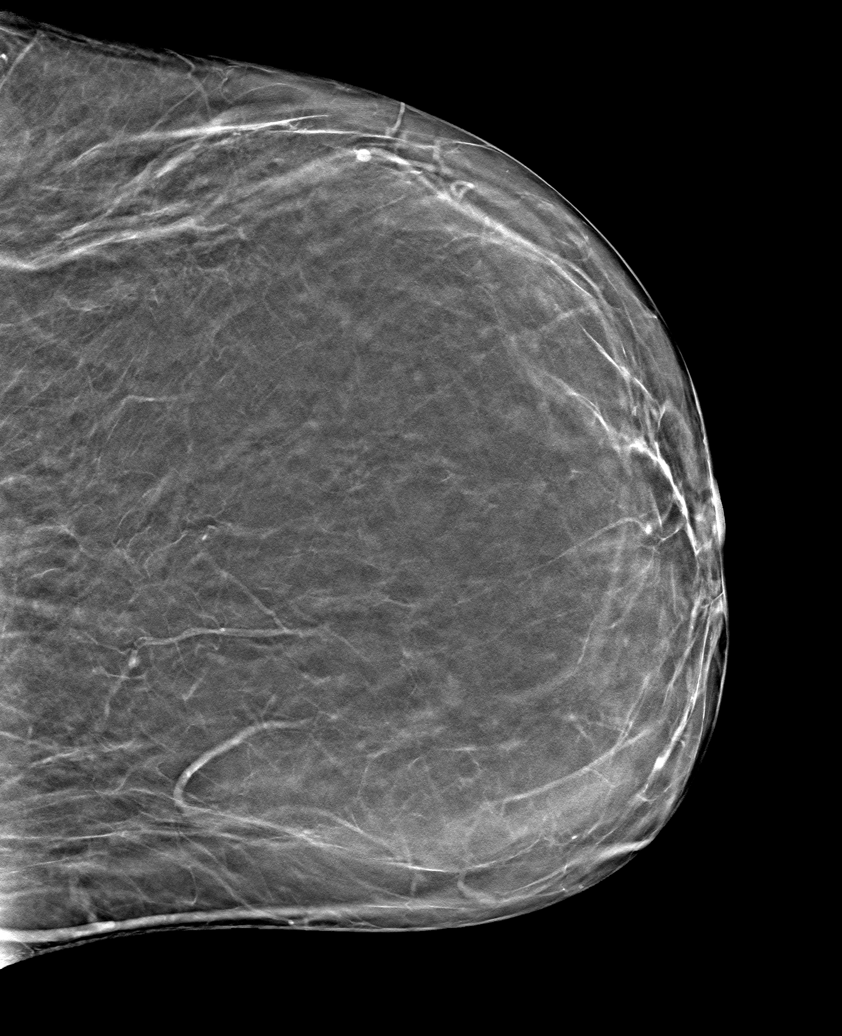

[6 of 30 positions shown; findings below may reference images not displayed]

FINDINGS: There are no findings suspicious for malignancy. Images were
processed with CAD.
IMPRESSION: No mammographic evidence of malignancy. A result letter of this
screening mammogram will be mailed directly to the patient.

RECOMMENDATION:
Screening mammogram in one year. (Code:[id number]-[id number])

BI-RADS CATEGORY  1: Negative.

## 2020-05-22 ENCOUNTER — Ambulatory Visit: Payer: Medicare HMO | Admitting: Podiatry

## 2020-05-28 ENCOUNTER — Ambulatory Visit (INDEPENDENT_AMBULATORY_CARE_PROVIDER_SITE_OTHER): Payer: Medicare HMO

## 2020-05-28 ENCOUNTER — Other Ambulatory Visit: Payer: Self-pay

## 2020-05-28 ENCOUNTER — Encounter: Payer: Self-pay | Admitting: Podiatry

## 2020-05-28 ENCOUNTER — Other Ambulatory Visit: Payer: Self-pay | Admitting: Podiatry

## 2020-05-28 ENCOUNTER — Ambulatory Visit: Payer: Medicare HMO | Admitting: Podiatry

## 2020-05-28 DIAGNOSIS — M7661 Achilles tendinitis, right leg: Secondary | ICD-10-CM

## 2020-05-28 DIAGNOSIS — M79671 Pain in right foot: Secondary | ICD-10-CM

## 2020-05-28 DIAGNOSIS — K611 Rectal abscess: Secondary | ICD-10-CM | POA: Insufficient documentation

## 2020-05-28 DIAGNOSIS — R197 Diarrhea, unspecified: Secondary | ICD-10-CM | POA: Insufficient documentation

## 2020-05-28 NOTE — Progress Notes (Signed)
  Subjective:  Patient ID: Paula Fields, female    DOB: 1965/11/30,  MRN: [id number]  Chief Complaint  Patient presents with  . Pain    Rt bottom heel pain x 1 yr;  10/10 when wlaking -no swelling Tx: OTC cusions    55 y.o. female presents with the above complaint.  Patient presents with complaint of right heel pain.  Patient states been going 1 year is 10 out of 10 when walking due to swelling associated she is tried over-the-counter cushioning some insoles none of which has helped.  Her pain scale is 8 out of 10 is burning sensation.  She is tried making some shoe gear modification which has not helped.  She has not seen anyone else prior to seeing me.  She denies any other acute complaints.   Review of Systems: Negative except as noted in the HPI. Denies N/V/F/Ch.  Past Medical History:  Diagnosis Date  . Arthritis   . H/O: hysterectomy   . Osteoarthritis   . SOB (shortness of breath)    No current outpatient medications on file.  Current Facility-Administered Medications:  .  triamcinolone acetonide (KENALOG) 10 MG/ML injection 10 mg, 10 mg, Other, Once, Regal, Kirstie Peri, DPM  Social History   Tobacco Use  Smoking Status Never Smoker  Smokeless Tobacco Never Used    No Known Allergies Objective:  There were no vitals filed for this visit. There is no height or weight on file to calculate BMI. Constitutional Well developed. Well nourished.  Vascular Dorsalis pedis pulses palpable bilaterally. Posterior tibial pulses palpable bilaterally. Capillary refill normal to all digits.  No cyanosis or clubbing noted. Pedal hair growth normal.  Neurologic Normal speech. Oriented to person, place, and time. Epicritic sensation to light touch grossly present bilaterally.  Dermatologic Nails well groomed and normal in appearance. No open wounds. No skin lesions.  Orthopedic:  Right posterior heel pain at the insertion of the Achilles tendon.  Pain with dorsiflexion of the ankle  joint.  No pain with plantarflexion of the ankle joint.  Pain on palpation to the posterior heel spur.  No pain at the plantar calcaneal tuber.  No pain at the peroneal tendon, posterior tibial tendon, ATFL ligament   Radiographs: 3 views of skeletally mature adult right foot: Posterior heel spurring noted severe in nature.  Haglund's deformity noted.  Mild plantar heel spurring noted.  No other bony abnormalities identified Assessment:   1. Achilles tendinitis, right leg    Plan:  Patient was evaluated and treated and all questions answered.  Right Achilles tendinitis with underlying Haglund's deformity -I explained to patient the etiology of tendinitis and various treatment options were extensively discussed.  Given the amount of pain she is having I believe patient will benefit from cam boot immobilization.  She already has a boot at home I have asked her to place her self in the boot for next 4 weeks.  She states understanding.  If there is improvement and still some residual pain I will discuss steroid injection versus getting an MRI.  Patient states understanding.  No follow-ups on file.

## 2020-05-28 NOTE — Patient Instructions (Signed)

## 2020-07-02 ENCOUNTER — Ambulatory Visit: Payer: Medicare HMO | Admitting: Podiatry

## 2020-07-02 ENCOUNTER — Other Ambulatory Visit: Payer: Self-pay

## 2020-07-02 ENCOUNTER — Encounter: Payer: Self-pay | Admitting: Podiatry

## 2020-07-02 DIAGNOSIS — M7751 Other enthesopathy of right foot: Secondary | ICD-10-CM | POA: Diagnosis not present

## 2020-07-02 DIAGNOSIS — M7661 Achilles tendinitis, right leg: Secondary | ICD-10-CM | POA: Diagnosis not present

## 2020-07-04 ENCOUNTER — Encounter: Payer: Self-pay | Admitting: Podiatry

## 2020-07-04 NOTE — Progress Notes (Signed)
Subjective:  Patient ID: Paula Fields, female    DOB: 1965/10/03,  MRN: [id number]  Chief Complaint  Patient presents with  . Foot Pain    Right foot pain PT stated that she still has some pain with her right foot     55 y.o. female presents with the above complaint.  Patient presents with complaint of right heel pain.  Patient states been going 1 year is 10 out of 10 when walking due to swelling associated she is tried over-the-counter cushioning some insoles none of which has helped.  Her pain scale is 8 out of 10 is burning sensation.  She is tried making some shoe gear modification which has not helped.  She has not seen anyone else prior to seeing me.  She denies any other acute complaints.   Review of Systems: Negative except as noted in the HPI. Denies N/V/F/Ch.  Past Medical History:  Diagnosis Date  . Arthritis   . H/O: hysterectomy   . Osteoarthritis   . SOB (shortness of breath)    No current outpatient medications on file.  Current Facility-Administered Medications:  .  triamcinolone acetonide (KENALOG) 10 MG/ML injection 10 mg, 10 mg, Other, Once, Regal, Kirstie Peri, DPM  Social History   Tobacco Use  Smoking Status Never Smoker  Smokeless Tobacco Never Used    No Known Allergies Objective:  There were no vitals filed for this visit. There is no height or weight on file to calculate BMI. Constitutional Well developed. Well nourished.  Vascular Dorsalis pedis pulses palpable bilaterally. Posterior tibial pulses palpable bilaterally. Capillary refill normal to all digits.  No cyanosis or clubbing noted. Pedal hair growth normal.  Neurologic Normal speech. Oriented to person, place, and time. Epicritic sensation to light touch grossly present bilaterally.  Dermatologic Nails well groomed and normal in appearance. No open wounds. No skin lesions.  Orthopedic:  Right posterior heel pain at the insertion of the Achilles tendon.  Pain with dorsiflexion of the  ankle joint.  No pain with plantarflexion of the ankle joint.  Pain on palpation to the posterior heel spur.  No pain at the plantar calcaneal tuber.  No pain at the peroneal tendon, posterior tibial tendon, ATFL ligament   Radiographs: 3 views of skeletally mature adult right foot: Posterior heel spurring noted severe in nature.  Haglund's deformity noted.  Mild plantar heel spurring noted.  No other bony abnormalities identified Assessment:   1. Achilles tendinitis, right leg   2. Capsulitis of right ankle    Plan:  Patient was evaluated and treated and all questions answered.  Right Achilles tendinitis with underlying Haglund's deformity -I explained to patient the etiology of tendinitis and various treatment options were extensively discussed.  Cam boot immobilization did not relieve her pain.  She states that it is a little bit better however she is not able to transition out of the boot.  She still has some residual pain present.  At this time patient may benefit from a steroid injection help decrease acute inflammatory component associate with pain.  Patient agrees with the plan would like to proceed with a steroid injection.  I also discussed with the patient there is a risk of rupture associated with a steroid injection in the Achilles area.  Patient states understanding would like to proceed with that despite the risks -A steroid injection was performed at right Kager's fat pad using 1% plain Lidocaine and 10 mg of Kenalog. This was well tolerated. -Tri-Lock ankle brace  was dispensed to give her stability while ambulation.  If there is improvement with it we will hold off on any further treatment plan.  If no improvement we will discuss MRI  No follow-ups on file.

## 2020-07-30 ENCOUNTER — Ambulatory Visit (INDEPENDENT_AMBULATORY_CARE_PROVIDER_SITE_OTHER): Payer: Medicare HMO | Admitting: Podiatry

## 2020-07-30 ENCOUNTER — Other Ambulatory Visit: Payer: Self-pay

## 2020-07-30 ENCOUNTER — Encounter: Payer: Self-pay | Admitting: Podiatry

## 2020-07-30 DIAGNOSIS — M7661 Achilles tendinitis, right leg: Secondary | ICD-10-CM

## 2020-07-30 NOTE — Progress Notes (Signed)
  Subjective:  Patient ID: Paula Fields, female    DOB: December 21, 1965,  MRN: [id number]  Chief Complaint  Patient presents with   Foot Pain    Right foot pain PT stated that she is doing well     55 y.o. female presents with the above complaint.  Patient presents with follow-up for right posterior tibial tendinitis.  She states she is 100% better the injection helped considerably.  She has been wearing her brace.  She denies any other acute complaints.  She would like to discuss next treatment options if any.   Review of Systems: Negative except as noted in the HPI. Denies N/V/F/Ch.  Past Medical History:  Diagnosis Date   Arthritis    H/O: hysterectomy    Osteoarthritis    SOB (shortness of breath)    No current outpatient medications on file.  Current Facility-Administered Medications:    triamcinolone acetonide (KENALOG) 10 MG/ML injection 10 mg, 10 mg, Other, Once, [place], Kirstie Peri, DPM  Social History   Tobacco Use  Smoking Status Never  Smokeless Tobacco Never    No Known Allergies Objective:  There were no vitals filed for this visit. There is no height or weight on file to calculate BMI. Constitutional Well developed. Well nourished.  Vascular Dorsalis pedis pulses palpable bilaterally. Posterior tibial pulses palpable bilaterally. Capillary refill normal to all digits.  No cyanosis or clubbing noted. Pedal hair growth normal.  Neurologic Normal speech. Oriented to person, place, and time. Epicritic sensation to light touch grossly present bilaterally.  Dermatologic Nails well groomed and normal in appearance. No open wounds. No skin lesions.  Orthopedic: No further right posterior heel pain at the insertion of the Achilles tendon.  No pain with dorsiflexion of the ankle joint.  No pain with plantarflexion of the ankle joint.  No pain on palpation to the posterior heel spur.  No pain at the plantar calcaneal tuber.  No pain at the peroneal tendon, posterior tibial  tendon, ATFL ligament   Radiographs: 3 views of skeletally mature adult right foot: Posterior heel spurring noted severe in nature.  Haglund's deformity noted.  Mild plantar heel spurring noted.  No other bony abnormalities identified Assessment:   1. Achilles tendinitis, right leg     Plan:  Patient was evaluated and treated and all questions answered.  Right Achilles tendinitis with underlying Haglund's deformity -I clinically healed with cam boot immobilization and steroid injection.  I discussed with her that if this reoccurs and it continues to hurt and that the bracing is not helping she will need surgical intervention after obtaining an MRI.  I discussed this with extensive detail she states understanding. -For now if any foot and ankle issues arise in the future I will asked her to come back and see me as needed.  She states understanding No follow-ups on file.

## 2021-12-03 LAB — COLOGUARD: COLOGUARD: NEGATIVE

## 2021-12-03 LAB — EXTERNAL GENERIC LAB PROCEDURE: COLOGUARD: NEGATIVE

## 2022-01-11 ENCOUNTER — Ambulatory Visit
Admission: RE | Admit: 2022-01-11 | Discharge: 2022-01-11 | Disposition: A | Discharge status: Home / Self Care | Payer: Medicare PPO | Source: Ambulatory Visit

## 2022-01-11 VITALS — BP 135/73 | HR 78 | Temp 98.0°F | Resp 16

## 2022-01-11 DIAGNOSIS — J209 Acute bronchitis, unspecified: Secondary | ICD-10-CM

## 2022-01-11 MED ORDER — PREDNISONE 50 MG PO TABS: ORAL_TABLET | ORAL | 0 refills | Status: DC

## 2022-01-11 NOTE — ED Provider Notes (Signed)
[name]    CSN: [id number] Arrival date & time: 01/11/22  1142      History   Chief Complaint Chief Complaint  Patient presents with   Cough    HPI Paula Fields is a 56 y.o. female.   Patient complains of a cough for the past 10 days.  Patient reports her primary care doctor put her on a cough medicine but she is continuing to cough.  Patient reports she has had wheezing at night.  She is using an albuterol inhaler without relief.  Patient denies any fever or chills.  The history is provided by the patient. No language interpreter was used.  Cough Cough characteristics:  Non-productive Sputum characteristics:  Nondescript Severity:  Moderate Onset quality:  Gradual   Past Medical History:  Diagnosis Date   Arthritis    H/O: hysterectomy    Osteoarthritis    SOB (shortness of breath)     Patient Active Problem List   Diagnosis Date Noted   Diarrhea 05/28/2020   Perirectal abscess 05/28/2020   Seasonal and perennial allergic rhinitis 11/23/2017   Chronic urticaria 11/23/2017   Pneumonia due to infectious organism 12/19/2014   RLQ abdominal pain 03/21/2014   Chest pain 10/08/2013   Dyspnea on exertion 10/08/2013   Retinal vascular occlusion 10/05/2013   Morbid obesity with BMI of 40.0-44.9, adult (HCC) 07/11/2013   Weight loss counseling, encounter for 07/11/2013   Class 3 severe obesity due to excess calories without serious comorbidity with body mass index (BMI) of 40.0 to 44.9 in adult [facility]) 07/11/2013   Osteoarthrosis of knee 06/06/2013    Past Surgical History:  Procedure Laterality Date   APPENDECTOMY     CESAREAN SECTION     CHOLECYSTECTOMY     REPLACEMENT TOTAL KNEE BILATERAL     SHOULDER SURGERY     right    OB History   No obstetric history on file.      Home Medications    Prior to Admission medications   Medication Sig Start Date End Date Taking? Authorizing Provider  albuterol (VENTOLIN HFA) 108 (90 Base) MCG/ACT  inhaler Inhale 2 puffs into the lungs every 4 (four) hours as needed. 08/11/21  Yes [provider]  HYDROcodone bit-homatropine (HYCODAN) 5-1.5 MG/5ML syrup Take by mouth. 01/08/22 01/18/22 Yes [provider]  predniSONE (DELTASONE) 50 MG tablet One tablet a day 01/11/22  Yes Elson Areas, PA-C    Family History Family History  Problem Relation Age of Onset   Heart disease Mother    Anuerysm Brother    Breast cancer Neg Hx     Social History Social History   Tobacco Use   Smoking status: Never   Smokeless tobacco: Never  Vaping Use   Vaping Use: Never used  Substance Use Topics   Alcohol use: No   Drug use: No     Allergies   Patient has no known allergies.   Review of Systems Review of Systems  Respiratory:  Positive for cough.   All other systems reviewed and are negative.    Physical Exam Triage Vital Signs ED Triage Vitals [01/11/22 1154]  Enc Vitals Group     BP 135/73     Pulse Rate 78     Resp 16     Temp 98 F (36.7 C)     Temp Source Oral     SpO2 96 %     Weight      Height  Head Circumference      Peak Flow      Pain Score 0     Pain Loc      Pain Edu?      Excl. in GC?    No data found.  Updated Vital Signs BP 135/73 (BP Location: Left Arm)   Pulse 78   Temp 98 F (36.7 C) (Oral)   Resp 16   SpO2 96%   Visual Acuity Right Eye Distance:   Left Eye Distance:   Bilateral Distance:    Right Eye Near:   Left Eye Near:    Bilateral Near:     Physical Exam Vitals and nursing note reviewed.  Constitutional:      Appearance: She is well-developed.  HENT:     Head: Normocephalic.     Mouth/Throat:     Mouth: Mucous membranes are moist.  Cardiovascular:     Rate and Rhythm: Normal rate.  Pulmonary:     Effort: Pulmonary effort is normal.  Abdominal:     General: There is no distension.  Musculoskeletal:        General: Normal range of motion.     Cervical back: Normal range of motion.  Skin:     General: Skin is warm.  Neurological:     General: No focal deficit present.     Mental Status: She is alert and oriented to person, place, and time.      UC Treatments / Results  Labs (all labs ordered are listed, but only abnormal results are displayed) Labs Reviewed - No data to display  EKG   Radiology No results found.  Procedures Procedures (including critical care time)  Medications Ordered in UC Medications - No data to display  Initial Impression / Assessment and Plan / UC Course  I have reviewed the triage vital signs and the nursing notes.  Pertinent labs & imaging results that were available during my care of the patient were reviewed by me and considered in my medical decision making (see chart for details).    MDM: I counseled patient on symptoms.  I will try treating her with prednisone for the next 5 days patient is advised to schedule to see her primary care physician for recheck  Final Clinical Impressions(s) / UC Diagnoses   Final diagnoses:  Acute bronchitis, unspecified organism   Discharge Instructions   None    ED Prescriptions     Medication Sig Dispense Auth. Provider   predniSONE (DELTASONE) 50 MG tablet One tablet a day 5 tablet Elson Areas, [place]      PDMP not reviewed this encounter. An After Visit Summary was printed and given to the patient.    Elson Areas, [place] 01/11/22 1241

## 2022-01-11 NOTE — ED Triage Notes (Signed)
Pt c/o cough onset ~ 10 days ago. Denies sore throat, nasal congestion, headaches. Denies pain in triage. States was given cough syrup last week without relief.

## 2023-03-29 DIAGNOSIS — H40013 Open angle with borderline findings, low risk, bilateral: Secondary | ICD-10-CM | POA: Diagnosis not present

## 2023-04-05 ENCOUNTER — Telehealth: Payer: Medicare PPO | Admitting: Physician Assistant

## 2023-04-05 DIAGNOSIS — J208 Acute bronchitis due to other specified organisms: Secondary | ICD-10-CM

## 2023-04-05 MED ORDER — ALBUTEROL SULFATE HFA 108 (90 BASE) MCG/ACT IN AERS: 2.0000 | INHALATION_SPRAY | Freq: Four times a day (QID) | RESPIRATORY_TRACT | 0 refills | Status: DC | PRN

## 2023-04-05 MED ORDER — BENZONATATE 100 MG PO CAPS: 100.0000 mg | ORAL_CAPSULE | Freq: Three times a day (TID) | ORAL | 0 refills | Status: DC | PRN

## 2023-04-05 MED ORDER — PREDNISONE 20 MG PO TABS: 40.0000 mg | ORAL_TABLET | Freq: Every day | ORAL | 0 refills | Status: DC

## 2023-04-05 NOTE — Patient Instructions (Signed)
 Paula Fields, thank you for joining [name], PA-C for today's virtual visit.  While this provider is not your primary care provider (PCP), if your PCP is located in our provider database this encounter information will be shared with them immediately following your visit.   A [facility] MyChart account gives you access to today's visit and all your visits, tests, and labs performed at [facility] " click here if you don't have a [facility] MyChart account or go to [email or web address]  Consent: (Patient) Paula Fields provided verbal consent for this virtual visit at the beginning of the encounter.  Current Medications:  Current Outpatient Medications:    albuterol (VENTOLIN HFA) 108 (90 Base) MCG/ACT inhaler, Inhale 2 puffs into the lungs every 6 (six) hours as needed for wheezing or shortness of breath., Disp: 8 g, Rfl: 0   benzonatate (TESSALON) 100 MG capsule, Take 1 capsule (100 mg total) by mouth 3 (three) times daily as needed for cough., Disp: 30 capsule, Rfl: 0   predniSONE (DELTASONE) 20 MG tablet, Take 2 tablets (40 mg total) by mouth daily with breakfast., Disp: 10 tablet, Rfl: 0   Medications ordered in this encounter:  Meds ordered this encounter  Medications   albuterol (VENTOLIN HFA) 108 (90 Base) MCG/ACT inhaler    Sig: Inhale 2 puffs into the lungs every 6 (six) hours as needed for wheezing or shortness of breath.    Dispense:  8 g    Refill:  0    Supervising Provider:   Merrilee Jansky [[id number]]   predniSONE (DELTASONE) 20 MG tablet    Sig: Take 2 tablets (40 mg total) by mouth daily with breakfast.    Dispense:  10 tablet    Refill:  0    Supervising Provider:   Merrilee Jansky [[id number]]   benzonatate (TESSALON) 100 MG capsule    Sig: Take 1 capsule (100 mg total) by mouth 3 (three) times daily as needed for cough.    Dispense:  30 capsule    Refill:  0    Supervising Provider:   Merrilee Jansky [[id number]]     *If you need  refills on other medications prior to your next appointment, please contact your pharmacy*  Follow-Up: Call back or seek an in-person evaluation if the symptoms worsen or if the condition fails to improve as anticipated.  [facility] [phone]  Other Instructions Upper Respiratory Infection, Adult An upper respiratory infection (URI) affects the nose, throat, and upper airways that lead to the lungs. The most common type of URI is often called the common cold. URIs usually get better on their own, without medical treatment. What are the causes? A URI is caused by a germ (virus). You may catch these germs by: Breathing in droplets from an infected person's cough or sneeze. Touching something that has the germ on it (is contaminated) and then touching your mouth, nose, or eyes. What increases the risk? You are more likely to get a URI if: You are very young or very old. You have close contact with others, such as at work, school, or a health care facility. You smoke. You have long-term (chronic) heart or lung disease. You have a weakened disease-fighting system (immune system). You have nasal allergies or asthma. You have a lot of stress. You have poor nutrition. What are the signs or symptoms? Runny or stuffy (congested) nose. Cough. Sneezing. Sore throat. Headache. Feeling tired (fatigue). Fever. Not wanting to  eat as much as usual. Pain in your forehead, behind your eyes, and over your cheekbones (sinus pain). Muscle aches. Redness or irritation of the eyes. Pressure in the ears or face. How is this treated? URIs usually get better on their own within 7-10 days. Medicines cannot cure URIs, but your doctor may recommend certain medicines to help relieve symptoms, such as: Over-the-counter cold medicines. Medicines to reduce coughing (cough suppressants). Coughing is a type of defense against infection that helps to clear the nose, throat, windpipe, and lungs  (respiratory system). Take these medicines only as told by your doctor. Medicines to lower your fever. Follow these instructions at home: Activity Rest as needed. If you have a fever, stay home from work or school until your fever is gone, or until your doctor says you may return to work or school. You should stay home until you cannot spread the infection anymore (you are not contagious). Your doctor may have you wear a face mask so you have less risk of spreading the infection. Relieving symptoms Rinse your mouth often with salt water. To make salt water, dissolve -1 tsp (3-6 g) of salt in 1 cup (237 mL) of warm water. Use a cool-mist humidifier to add moisture to the air. This can help you breathe more easily. Eating and drinking  Drink enough fluid to keep your pee (urine) pale yellow. Eat soups and other clear broths. General instructions  Take over-the-counter and prescription medicines only as told by your doctor. Do not smoke or use any products that contain nicotine or tobacco. If you need help quitting, ask your doctor. Avoid being where people are smoking (avoid secondhand smoke). Stay up to date on all your shots (immunizations), and get the flu shot every year. Keep all follow-up visits. How to prevent the spread of infection to others  Wash your hands with soap and water for at least 20 seconds. If you cannot use soap and water, use hand sanitizer. Avoid touching your mouth, face, eyes, or nose. Cough or sneeze into a tissue or your sleeve or elbow. Do not cough or sneeze into your hand or into the air. Contact a doctor if: You are getting worse, not better. You have any of these: A fever or chills. Brown or red mucus in your nose. Yellow or brown fluid (discharge)coming from your nose. Pain in your face, especially when you bend forward. Swollen neck glands. Pain when you swallow. White areas in the back of your throat. Get help right away if: You have shortness  of breath that gets worse. You have very bad or constant: Headache. Ear pain. Pain in your forehead, behind your eyes, and over your cheekbones (sinus pain). Chest pain. You have long-lasting (chronic) lung disease along with any of these: Making high-pitched whistling sounds when you breathe, most often when you breathe out (wheezing). Long-lasting cough (more than 14 days). Coughing up blood. A change in your usual mucus. You have a stiff neck. You have changes in your: Vision. Hearing. Thinking. Mood. These symptoms may be an emergency. Get help right away. Call 911. Do not wait to see if the symptoms will go away. Do not drive yourself to the hospital. Summary An upper respiratory infection (URI) is caused by a germ (virus). The most common type of URI is often called the common cold. URIs usually get better within 7-10 days. Take over-the-counter and prescription medicines only as told by your doctor. This information is not intended to replace advice given to  you by your health care provider. Make sure you discuss any questions you have with your health care provider. Document Revised: 08/27/2020 Document Reviewed: 08/27/2020 Elsevier Patient Education  2024 Elsevier Inc.   If you have been instructed to have an in-person evaluation today at a local Urgent Care facility, please use the link below. It will take you to a list of all of our available [facility], including address, phone number and hours of operation. Please do not delay care.  [facility]  If you or a family member do not have a primary care provider, use the link below to schedule a visit and establish care. When you choose a [facility] primary care physician or advanced practice provider, you gain a long-term partner in health. Find a Primary Care Provider  Learn more about [facility] in-office and virtual care options:  - Get Care Now

## 2023-04-05 NOTE — Progress Notes (Signed)
 Virtual Visit Consent   [name], you are scheduled for a virtual visit with a [facility] provider today. Just as with appointments in the office, your consent must be obtained to participate. Your consent will be active for this visit and any virtual visit you may have with one of our providers in the next 365 days. If you have a MyChart account, a copy of this consent can be sent to you electronically.  As this is a virtual visit, video technology does not allow for your provider to perform a traditional examination. This may limit your provider's ability to fully assess your condition. If your provider identifies any concerns that need to be evaluated in person or the need to arrange testing (such as labs, EKG, etc.), we will make arrangements to do so. Although advances in technology are sophisticated, we cannot ensure that it will always work on either your end or our end. If the connection with a video visit is poor, the visit may have to be switched to a telephone visit. With either a video or telephone visit, we are not always able to ensure that we have a secure connection.  By engaging in this virtual visit, you consent to the provision of healthcare and authorize for your insurance to be billed (if applicable) for the services provided during this visit. Depending on your insurance coverage, you may receive a charge related to this service.  I need to obtain your verbal consent now. Are you willing to proceed with your visit today? Paula Fields has provided verbal consent on 04/05/2023 for a virtual visit (video or telephone). [name], [place]  Date: 04/05/2023 12:07 PM   Virtual Visit via Video Note   I, [name], connected with  Paula Fields  ([id number], 02-07-1966) on 04/05/23 at 12:15 PM EST by a video-enabled telemedicine application and verified that I am speaking with the correct person using two identifiers.  Location: Patient: Virtual Visit Location  Patient: Home Provider: Virtual Visit Location Provider: Home Office   I discussed the limitations of evaluation and management by telemedicine and the availability of in person appointments. The patient expressed understanding and agreed to proceed.    History of Present Illness: Paula Fields is a 58 y.o. who identifies as a female who was assigned female at birth, and is being seen today for URI symptoms starting last week and progressing since onset. Started with sore throat and fever (low-grade) and nasal congestion. Since then throat has improved, fever spiked with increased congestion, now chest congestion, cough and wheezing. Notes cough is dry but persistent and making her SOB and hard to sleep. Denies chest pain. Denies recent travel or sick contact.   OTC -- Mucinex, Generic cough and cold.   HPI: HPI  Problems:  Patient Active Problem List   Diagnosis Date Noted   Diarrhea 05/28/2020   Perirectal abscess 05/28/2020   Seasonal and perennial allergic rhinitis 11/23/2017   Chronic urticaria 11/23/2017   Pneumonia due to infectious organism 12/19/2014   RLQ abdominal pain 03/21/2014   Chest pain 10/08/2013   Dyspnea on exertion 10/08/2013   Retinal vascular occlusion 10/05/2013   Morbid obesity with BMI of 40.0-44.9, adult (HCC) 07/11/2013   Weight loss counseling, encounter for 07/11/2013   Class 3 severe obesity due to excess calories without serious comorbidity with body mass index (BMI) of 40.0 to 44.9 in adult [facility]) 07/11/2013   Osteoarthrosis of knee 06/06/2013    Allergies:  Allergies  Allergen Reactions   Levofloxacin Other (See Comments)    headache   Medications:  Current Outpatient Medications:    albuterol (VENTOLIN HFA) 108 (90 Base) MCG/ACT inhaler, Inhale 2 puffs into the lungs every 6 (six) hours as needed for wheezing or shortness of breath., Disp: 8 g, Rfl: 0   benzonatate (TESSALON) 100 MG capsule, Take 1 capsule (100 mg total) by mouth 3 (three) times  daily as needed for cough., Disp: 30 capsule, Rfl: 0   predniSONE (DELTASONE) 20 MG tablet, Take 2 tablets (40 mg total) by mouth daily with breakfast., Disp: 10 tablet, Rfl: 0  Observations/Objective: Patient is well-developed, well-nourished in no acute distress.  Resting comfortably at home.  Head is normocephalic, atraumatic.  No labored breathing. Speech is clear and coherent with logical content.  Patient is alert and oriented at baseline.   Assessment and Plan: 1. Viral bronchitis (Primary) - albuterol (VENTOLIN HFA) 108 (90 Base) MCG/ACT inhaler; Inhale 2 puffs into the lungs every 6 (six) hours as needed for wheezing or shortness of breath.  Dispense: 8 g; Refill: 0 - predniSONE (DELTASONE) 20 MG tablet; Take 2 tablets (40 mg total) by mouth daily with breakfast.  Dispense: 10 tablet; Refill: 0 - benzonatate (TESSALON) 100 MG capsule; Take 1 capsule (100 mg total) by mouth 3 (three) times daily as needed for cough.  Dispense: 30 capsule; Refill: 0  Rx Albuterol and Prednisone.  Increase fluids.  Rest.  Saline nasal spray.  Probiotic.  Mucinex as directed.  Humidifier in bedroom. Tessalon per orders.  Call or return to clinic if symptoms are not improving.   Follow Up Instructions: I discussed the assessment and treatment plan with the patient. The patient was provided an opportunity to ask questions and all were answered. The patient agreed with the plan and demonstrated an understanding of the instructions.  A copy of instructions were sent to the patient via MyChart unless otherwise noted below.   The patient was advised to call back or seek an in-person evaluation if the symptoms worsen or if the condition fails to improve as anticipated.    [name], PA-C

## 2023-04-06 ENCOUNTER — Ambulatory Visit: Payer: Medicare PPO

## 2023-04-06 DIAGNOSIS — M25571 Pain in right ankle and joints of right foot: Secondary | ICD-10-CM | POA: Diagnosis not present

## 2023-04-06 DIAGNOSIS — M25572 Pain in left ankle and joints of left foot: Secondary | ICD-10-CM | POA: Diagnosis not present

## 2023-04-06 DIAGNOSIS — S86011S Strain of right Achilles tendon, sequela: Secondary | ICD-10-CM | POA: Diagnosis not present

## 2023-04-06 DIAGNOSIS — S86012S Strain of left Achilles tendon, sequela: Secondary | ICD-10-CM | POA: Diagnosis not present

## 2023-04-07 ENCOUNTER — Telehealth: Payer: Medicare PPO | Admitting: Physician Assistant

## 2023-04-07 DIAGNOSIS — J069 Acute upper respiratory infection, unspecified: Secondary | ICD-10-CM

## 2023-04-07 DIAGNOSIS — B9689 Other specified bacterial agents as the cause of diseases classified elsewhere: Secondary | ICD-10-CM

## 2023-04-07 MED ORDER — PROMETHAZINE-DM 6.25-15 MG/5ML PO SYRP: 5.0000 mL | ORAL_SOLUTION | Freq: Four times a day (QID) | ORAL | 0 refills | Status: DC | PRN

## 2023-04-07 MED ORDER — DOXYCYCLINE HYCLATE 100 MG PO TABS: 100.0000 mg | ORAL_TABLET | Freq: Two times a day (BID) | ORAL | 0 refills | Status: AC

## 2023-04-07 NOTE — Patient Instructions (Signed)
 Paula Fields, thank you for joining [name], PA-C for today's virtual visit.  While this provider is not your primary care provider (PCP), if your PCP is located in our provider database this encounter information will be shared with them immediately following your visit.   A [facility] MyChart account gives you access to today's visit and all your visits, tests, and labs performed at [facility] " click here if you don't have a [facility] MyChart account or go to [email or web address]  Consent: (Patient) Paula Fields provided verbal consent for this virtual visit at the beginning of the encounter.  Current Medications:  Current Outpatient Medications:    doxycycline (VIBRA-TABS) 100 MG tablet, Take 1 tablet (100 mg total) by mouth 2 (two) times daily., Disp: 20 tablet, Rfl: 0   promethazine-dextromethorphan (PROMETHAZINE-DM) 6.25-15 MG/5ML syrup, Take 5 mLs by mouth 4 (four) times daily as needed for cough., Disp: 118 mL, Rfl: 0   albuterol (VENTOLIN HFA) 108 (90 Base) MCG/ACT inhaler, Inhale 2 puffs into the lungs every 6 (six) hours as needed for wheezing or shortness of breath., Disp: 8 g, Rfl: 0   predniSONE (DELTASONE) 20 MG tablet, Take 2 tablets (40 mg total) by mouth daily with breakfast., Disp: 10 tablet, Rfl: 0   Medications ordered in this encounter:  Meds ordered this encounter  Medications   promethazine-dextromethorphan (PROMETHAZINE-DM) 6.25-15 MG/5ML syrup    Sig: Take 5 mLs by mouth 4 (four) times daily as needed for cough.    Dispense:  118 mL    Refill:  0    Supervising Provider:   Merrilee Jansky [[id number]]   doxycycline (VIBRA-TABS) 100 MG tablet    Sig: Take 1 tablet (100 mg total) by mouth 2 (two) times daily.    Dispense:  20 tablet    Refill:  0    Supervising Provider:   Merrilee Jansky [[id number]]     *If you need refills on other medications prior to your next appointment, please contact your pharmacy*  Follow-Up: Call  back or seek an in-person evaluation if the symptoms worsen or if the condition fails to improve as anticipated.  [facility] [phone]  Other Instructions Please take antibiotic as directed.  Increase fluid intake.  Use Saline nasal spray.  Take a daily multivitamin. Finish course of prednisone. Start new cough medication as directed.  Place a humidifier in the bedroom.  Please call or return clinic if symptoms are not improving.  Sinusitis Sinusitis is redness, soreness, and swelling (inflammation) of the paranasal sinuses. Paranasal sinuses are air pockets within the bones of your face (beneath the eyes, the middle of the forehead, or above the eyes). In healthy paranasal sinuses, mucus is able to drain out, and air is able to circulate through them by way of your nose. However, when your paranasal sinuses are inflamed, mucus and air can become trapped. This can allow bacteria and other germs to grow and cause infection. Sinusitis can develop quickly and last only a short time (acute) or continue over a long period (chronic). Sinusitis that lasts for more than 12 weeks is considered chronic.  CAUSES  Causes of sinusitis include: Allergies. Structural abnormalities, such as displacement of the cartilage that separates your nostrils (deviated septum), which can decrease the air flow through your nose and sinuses and affect sinus drainage. Functional abnormalities, such as when the small hairs (cilia) that line your sinuses and help remove mucus do not work properly or  are not present. SYMPTOMS  Symptoms of acute and chronic sinusitis are the same. The primary symptoms are pain and pressure around the affected sinuses. Other symptoms include: Upper toothache. Earache. Headache. Bad breath. Decreased sense of smell and taste. A cough, which worsens when you are lying flat. Fatigue. Fever. Thick drainage from your nose, which often is green and may contain pus  (purulent). Swelling and warmth over the affected sinuses. DIAGNOSIS  Your caregiver will perform a physical exam. During the exam, your caregiver may: Look in your nose for signs of abnormal growths in your nostrils (nasal polyps). Tap over the affected sinus to check for signs of infection. View the inside of your sinuses (endoscopy) with a special imaging device with a light attached (endoscope), which is inserted into your sinuses. If your caregiver suspects that you have chronic sinusitis, one or more of the following tests may be recommended: Allergy tests. Nasal culture A sample of mucus is taken from your nose and sent to a lab and screened for bacteria. Nasal cytology A sample of mucus is taken from your nose and examined by your caregiver to determine if your sinusitis is related to an allergy. TREATMENT  Most cases of acute sinusitis are related to a viral infection and will resolve on their own within 10 days. Sometimes medicines are prescribed to help relieve symptoms (pain medicine, decongestants, nasal steroid sprays, or saline sprays).  However, for sinusitis related to a bacterial infection, your caregiver will prescribe antibiotic medicines. These are medicines that will help kill the bacteria causing the infection.  Rarely, sinusitis is caused by a fungal infection. In theses cases, your caregiver will prescribe antifungal medicine. For some cases of chronic sinusitis, surgery is needed. Generally, these are cases in which sinusitis recurs more than 3 times per year, despite other treatments. HOME CARE INSTRUCTIONS  Drink plenty of water. Water helps thin the mucus so your sinuses can drain more easily. Use a humidifier. Inhale steam 3 to 4 times a day (for example, sit in the bathroom with the shower running). Apply a warm, moist washcloth to your face 3 to 4 times a day, or as directed by your caregiver. Use saline nasal sprays to help moisten and clean your sinuses. Take  over-the-counter or prescription medicines for pain, discomfort, or fever only as directed by your caregiver. SEEK IMMEDIATE MEDICAL CARE IF: You have increasing pain or severe headaches. You have nausea, vomiting, or drowsiness. You have swelling around your face. You have vision problems. You have a stiff neck. You have difficulty breathing. MAKE SURE YOU:  Understand these instructions. Will watch your condition. Will get help right away if you are not doing well or get worse. Document Released: 01/25/2005 Document Revised: 04/19/2011 Document Reviewed: 02/09/2011 [facility] Patient Information 2014 [place], [place].    If you have been instructed to have an in-person evaluation today at a local Urgent Care facility, please use the link below. It will take you to a list of all of our available [facility], including address, phone number and hours of operation. Please do not delay care.  [facility]  If you or a family member do not have a primary care provider, use the link below to schedule a visit and establish care. When you choose a [facility] primary care physician or advanced practice provider, you gain a long-term partner in health. Find a Primary Care Provider  Learn more about [facility] in-office and virtual care options: [facility] - Get Care Now

## 2023-04-07 NOTE — Progress Notes (Signed)
 Virtual Visit Consent   Paula Fields, you are scheduled for a virtual visit with a [facility] provider today. Just as with appointments in the office, your consent must be obtained to participate. Your consent will be active for this visit and any virtual visit you may have with one of our providers in the next 365 days. If you have a MyChart account, a copy of this consent can be sent to you electronically.  As this is a virtual visit, video technology does not allow for your provider to perform a traditional examination. This may limit your provider's ability to fully assess your condition. If your provider identifies any concerns that need to be evaluated in person or the need to arrange testing (such as labs, EKG, etc.), we will make arrangements to do so. Although advances in technology are sophisticated, we cannot ensure that it will always work on either your end or our end. If the connection with a video visit is poor, the visit may have to be switched to a telephone visit. With either a video or telephone visit, we are not always able to ensure that we have a secure connection.  By engaging in this virtual visit, you consent to the provision of healthcare and authorize for your insurance to be billed (if applicable) for the services provided during this visit. Depending on your insurance coverage, you may receive a charge related to this service.  I need to obtain your verbal consent now. Are you willing to proceed with your visit today? Paula Fields has provided verbal consent on 04/07/2023 for a virtual visit (video or telephone). Paula Fields, [place]  Date: 04/07/2023 4:39 PM   Virtual Visit via Video Note   I, Paula Fields, connected with  [name]  ([id number], 24-Sep-1965) on 04/07/23 at  4:30 PM EST by a video-enabled telemedicine application and verified that I am speaking with the correct person using two identifiers.  Location: Patient: Virtual Visit Location  Patient: Home Provider: Virtual Visit Location Provider: Home Office   I discussed the limitations of evaluation and management by telemedicine and the availability of in person appointments. The patient expressed understanding and agreed to proceed.    History of Present Illness: Paula Fields is a 58 y.o. who identifies as a female who was assigned female at birth, and is being seen today for continued and progressive URI symptoms since last visit despite taking her steroid and cough medication. Notes chest congestion improving, still with a bit of cough. Now with sinus pain and pressure and facial pain. Denies fever, chills. Is taking her albuterol inhaler every 6 hours as needed with relief. Denies CP or SOB.   HPI: HPI  Problems:  Patient Active Problem List   Diagnosis Date Noted   Diarrhea 05/28/2020   Perirectal abscess 05/28/2020   Seasonal and perennial allergic rhinitis 11/23/2017   Chronic urticaria 11/23/2017   Pneumonia due to infectious organism 12/19/2014   RLQ abdominal pain 03/21/2014   Chest pain 10/08/2013   Dyspnea on exertion 10/08/2013   Retinal vascular occlusion 10/05/2013   Morbid obesity with BMI of 40.0-44.9, adult (HCC) 07/11/2013   Weight loss counseling, encounter for 07/11/2013   Class 3 severe obesity due to excess calories without serious comorbidity with body mass index (BMI) of 40.0 to 44.9 in adult [facility]) 07/11/2013   Osteoarthrosis of knee 06/06/2013    Allergies:  Allergies  Allergen Reactions   Levofloxacin Other (See Comments)  headache   Medications:  Current Outpatient Medications:    doxycycline (VIBRA-TABS) 100 MG tablet, Take 1 tablet (100 mg total) by mouth 2 (two) times daily., Disp: 20 tablet, Rfl: 0   promethazine-dextromethorphan (PROMETHAZINE-DM) 6.25-15 MG/5ML syrup, Take 5 mLs by mouth 4 (four) times daily as needed for cough., Disp: 118 mL, Rfl: 0   albuterol (VENTOLIN HFA) 108 (90 Base) MCG/ACT inhaler, Inhale 2 puffs into  the lungs every 6 (six) hours as needed for wheezing or shortness of breath., Disp: 8 g, Rfl: 0   predniSONE (DELTASONE) 20 MG tablet, Take 2 tablets (40 mg total) by mouth daily with breakfast., Disp: 10 tablet, Rfl: 0  Observations/Objective: Patient is well-developed, well-nourished in no acute distress.  Resting comfortably at home.  Head is normocephalic, atraumatic.  No labored breathing. Speech is clear and coherent with logical content.  Patient is alert and oriented at baseline.   Assessment and Plan: 1. Bacterial URI (Primary) - promethazine-dextromethorphan (PROMETHAZINE-DM) 6.25-15 MG/5ML syrup; Take 5 mLs by mouth 4 (four) times daily as needed for cough.  Dispense: 118 mL; Refill: 0 - doxycycline (VIBRA-TABS) 100 MG tablet; Take 1 tablet (100 mg total) by mouth 2 (two) times daily.  Dispense: 20 tablet; Refill: 0  Concern for development of secondary sinusitis. Add on Doxycycline and promethazine-DM. Continue course of prednisone. In person for any non-resolving, new or worsening symptoms.  Follow Up Instructions: I discussed the assessment and treatment plan with the patient. The patient was provided an opportunity to ask questions and all were answered. The patient agreed with the plan and demonstrated an understanding of the instructions.  A copy of instructions were sent to the patient via MyChart unless otherwise noted below.   The patient was advised to call back or seek an in-person evaluation if the symptoms worsen or if the condition fails to improve as anticipated.    Paula Climes, PA-C

## 2023-04-11 DIAGNOSIS — S86011D Strain of right Achilles tendon, subsequent encounter: Secondary | ICD-10-CM | POA: Diagnosis not present

## 2023-04-11 DIAGNOSIS — M25572 Pain in left ankle and joints of left foot: Secondary | ICD-10-CM | POA: Diagnosis not present

## 2023-04-11 DIAGNOSIS — S86012S Strain of left Achilles tendon, sequela: Secondary | ICD-10-CM | POA: Diagnosis not present

## 2023-04-11 DIAGNOSIS — M25571 Pain in right ankle and joints of right foot: Secondary | ICD-10-CM | POA: Diagnosis not present

## 2023-04-13 DIAGNOSIS — S86011S Strain of right Achilles tendon, sequela: Secondary | ICD-10-CM | POA: Diagnosis not present

## 2023-04-13 DIAGNOSIS — M25571 Pain in right ankle and joints of right foot: Secondary | ICD-10-CM | POA: Diagnosis not present

## 2023-04-13 DIAGNOSIS — S86012S Strain of left Achilles tendon, sequela: Secondary | ICD-10-CM | POA: Diagnosis not present

## 2023-04-13 DIAGNOSIS — M25572 Pain in left ankle and joints of left foot: Secondary | ICD-10-CM | POA: Diagnosis not present

## 2023-04-18 DIAGNOSIS — S86011S Strain of right Achilles tendon, sequela: Secondary | ICD-10-CM | POA: Diagnosis not present

## 2023-04-18 DIAGNOSIS — M25571 Pain in right ankle and joints of right foot: Secondary | ICD-10-CM | POA: Diagnosis not present

## 2023-04-18 DIAGNOSIS — M25572 Pain in left ankle and joints of left foot: Secondary | ICD-10-CM | POA: Diagnosis not present

## 2023-04-18 DIAGNOSIS — S86012S Strain of left Achilles tendon, sequela: Secondary | ICD-10-CM | POA: Diagnosis not present

## 2023-04-25 DIAGNOSIS — M25571 Pain in right ankle and joints of right foot: Secondary | ICD-10-CM | POA: Diagnosis not present

## 2023-04-25 DIAGNOSIS — S86011S Strain of right Achilles tendon, sequela: Secondary | ICD-10-CM | POA: Diagnosis not present

## 2023-04-25 DIAGNOSIS — M25572 Pain in left ankle and joints of left foot: Secondary | ICD-10-CM | POA: Diagnosis not present

## 2023-04-25 DIAGNOSIS — S86012S Strain of left Achilles tendon, sequela: Secondary | ICD-10-CM | POA: Diagnosis not present

## 2023-05-04 DIAGNOSIS — Z1330 Encounter for screening examination for mental health and behavioral disorders, unspecified: Secondary | ICD-10-CM | POA: Diagnosis not present

## 2023-05-04 DIAGNOSIS — E66813 Obesity, class 3: Secondary | ICD-10-CM | POA: Diagnosis not present

## 2023-05-04 DIAGNOSIS — Z6841 Body Mass Index (BMI) 40.0 and over, adult: Secondary | ICD-10-CM | POA: Diagnosis not present

## 2023-05-04 DIAGNOSIS — R051 Acute cough: Secondary | ICD-10-CM | POA: Diagnosis not present

## 2023-05-04 DIAGNOSIS — R0981 Nasal congestion: Secondary | ICD-10-CM | POA: Diagnosis not present

## 2024-01-26 DIAGNOSIS — M25579 Pain in unspecified ankle and joints of unspecified foot: Secondary | ICD-10-CM | POA: Insufficient documentation

## 2024-01-26 DIAGNOSIS — M25562 Pain in left knee: Secondary | ICD-10-CM | POA: Insufficient documentation

## 2024-01-26 DIAGNOSIS — M7660 Achilles tendinitis, unspecified leg: Secondary | ICD-10-CM | POA: Insufficient documentation

## 2024-01-29 DIAGNOSIS — M7730 Calcaneal spur, unspecified foot: Secondary | ICD-10-CM | POA: Insufficient documentation

## 2024-01-29 DIAGNOSIS — M7662 Achilles tendinitis, left leg: Secondary | ICD-10-CM | POA: Insufficient documentation

## 2024-02-27 ENCOUNTER — Ambulatory Visit
Admission: RE | Admit: 2024-02-27 | Discharge: 2024-02-27 | Disposition: A | Discharge status: Home / Self Care | Source: Ambulatory Visit

## 2024-02-27 VITALS — BP 142/84 | HR 74 | Temp 97.6°F | Resp 18 | Wt 243.8 lb

## 2024-02-27 DIAGNOSIS — J329 Chronic sinusitis, unspecified: Secondary | ICD-10-CM

## 2024-02-27 DIAGNOSIS — J208 Acute bronchitis due to other specified organisms: Secondary | ICD-10-CM

## 2024-02-27 DIAGNOSIS — J40 Bronchitis, not specified as acute or chronic: Secondary | ICD-10-CM | POA: Diagnosis not present

## 2024-02-27 MED ORDER — GUAIFENESIN ER 600 MG PO TB12: 600.0000 mg | ORAL_TABLET | Freq: Two times a day (BID) | ORAL | 0 refills | Status: AC

## 2024-02-27 MED ORDER — ALBUTEROL SULFATE HFA 108 (90 BASE) MCG/ACT IN AERS: 2.0000 | INHALATION_SPRAY | Freq: Four times a day (QID) | RESPIRATORY_TRACT | 0 refills | Status: AC | PRN

## 2024-02-27 MED ORDER — PREDNISONE 50 MG PO TABS: ORAL_TABLET | ORAL | 0 refills | Status: AC

## 2024-02-27 MED ORDER — BENZONATATE 100 MG PO CAPS: 100.0000 mg | ORAL_CAPSULE | Freq: Three times a day (TID) | ORAL | 0 refills | Status: AC

## 2024-02-27 NOTE — ED Triage Notes (Addendum)
 Pt presents c/o nasal congestion and cough x 6 days. Pt states,  I started feeling a little off Wed. Night. Thursday my nose got stopped up and has been ever since. I also got this cough that gets worse at night. I tested myself at home for COVID and it was negative. My ears are popping too. Pt denies emesis, diarrhea, or fever.

## 2024-02-27 NOTE — ED Provider Notes (Signed)
 " [name]    CSN: [id number] Arrival date & time: 02/27/24  1535      History   Chief Complaint Chief Complaint  Patient presents with   Nasal Congestion    I have a cough also worst at night nothing is helping it. - Entered by patient   Cough    HPI Paula Fields is a 59 y.o. female.   Pt presents today due to 6 days worth of dry cough, that is worse at night, and nasal congestion. Pt states that she tested neg for covid at home and has been using OTC cold meds without relief. Pt states that she get something similar every year.   The history is provided by the patient.  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   Ankle pain 01/26/2024   Left knee pain 01/26/2024   Diarrhea 05/28/2020   Perirectal abscess 05/28/2020   Seasonal and perennial allergic rhinitis 11/23/2017   Chronic urticaria 11/23/2017   Pneumonia due to infectious organism 12/19/2014   RLQ abdominal pain 03/21/2014   Chest pain 10/08/2013   Dyspnea on exertion 10/08/2013   Retinal vascular occlusion 10/05/2013   Morbid obesity with BMI of 40.0-44.9, adult (HCC) 07/11/2013   Weight loss counseling, encounter for 07/11/2013   Class 3 severe obesity due to excess calories without serious comorbidity with body mass index (BMI) of 40.0 to 44.9 in adult [facility]) 07/11/2013   Osteoarthrosis of knee 06/06/2013    Past Surgical History:  Procedure Laterality Date   APPENDECTOMY     CESAREAN SECTION     CHOLECYSTECTOMY     REPLACEMENT TOTAL KNEE BILATERAL     SHOULDER SURGERY     right    OB History   No obstetric history on file.      Home Medications    Prior to Admission medications  Medication Sig  Start Date End Date Taking? Authorizing Provider  benzonatate  (TESSALON ) 100 MG capsule Take 1 capsule (100 mg total) by mouth every 8 (eight) hours. 02/27/24  Yes Andra Corean BROCKS, PA-C  guaiFENesin  (MUCINEX ) 600 MG 12 hr tablet Take 1 tablet (600 mg total) by mouth 2 (two) times daily for 10 days. 02/27/24 03/08/24 Yes Andra Corean C, PA-C  OVER THE COUNTER MEDICATION Cold and Flu pill   Yes [provider]  predniSONE  (DELTASONE ) 50 MG tablet Take 1 tab po daily for 5 days 02/27/24  Yes Andra Corean BROCKS, PA-C  triamcinolone  cream (KENALOG ) 0.1 % Apply topically 2 (two) times daily. 09/30/23  Yes [provider]  albuterol  (VENTOLIN  HFA) 108 (90 Base) MCG/ACT inhaler Inhale 2 puffs into the lungs every 6 (six) hours as needed for wheezing or shortness of breath. 02/27/24   Andra Corean BROCKS, PA-C  doxycycline  (VIBRA -TABS) 100 MG tablet Take 1 tablet (100 mg total) by mouth 2 (two) times daily. 04/07/23   Gladis Elsie BROCKS, PA-C  fluticasone (FLONASE) 50 MCG/ACT nasal spray USE 1 SQUIRT TO EACH NOSTRIL DAILY AS NEEDED FOR SINUS SYMPTOMS    [provider]  HYDROcodone-acetaminophen  (NORCO) 7.5-325 MG tablet TAKE 1 TABLET BY MOUTH EVERY 6 HOURS AS NEEDED FOR DENTAL PAIN  [provider]  meloxicam (MOBIC) 15 MG tablet TAKE 1 TABLET EVERY DAY BY ORAL ROUTE WITH MEAL(S).    [provider]  naproxen sodium (ALEVE) 220 MG tablet Take 220 mg by mouth.    [provider]  topiramate (TOPAMAX) 25 MG tablet PLEASE SEE ATTACHED FOR DETAILED DIRECTIONS    [provider]    Family History Family History  Problem Relation Age of Onset   Heart disease Mother    Anuerysm Brother    Breast cancer Neg Hx     Social History Social History[1]   Allergies   Levofloxacin   Review of Systems Review of Systems  Respiratory:  Positive for cough.      Physical Exam Triage Vital Signs ED Triage Vitals  Encounter Vitals  Group     BP 02/27/24 1621 (!) 142/84     Girls Systolic BP Percentile --      Girls Diastolic BP Percentile --      Boys Systolic BP Percentile --      Boys Diastolic BP Percentile --      Pulse Rate 02/27/24 1621 74     Resp 02/27/24 1621 18     Temp 02/27/24 1621 97.6 F (36.4 C)     Temp Source 02/27/24 1621 Oral     SpO2 02/27/24 1621 98 %     Weight 02/27/24 1621 243 lb 13.3 oz (110.6 kg)     Height --      Head Circumference --      Peak Flow --      Pain Score 02/27/24 1620 0     Pain Loc --      Pain Education --      Exclude from Growth Chart --    No data found.  Updated Vital Signs BP (!) 142/84 (BP Location: Right Arm)   Pulse 74   Temp 97.6 F (36.4 C) (Oral)   Resp 18   Wt 243 lb 13.3 oz (110.6 kg)   SpO2 98%   BMI 41.85 kg/m   Visual Acuity Right Eye Distance:   Left Eye Distance:   Bilateral Distance:    Right Eye Near:   Left Eye Near:    Bilateral Near:     Physical Exam Vitals and nursing note reviewed.  Constitutional:      General: She is not in acute distress.    Appearance: Normal appearance. She is not ill-appearing, toxic-appearing or diaphoretic.  HENT:     Nose: Congestion (markedly enlarged turbinates) present. No rhinorrhea.     Mouth/Throat:     Mouth: Mucous membranes are moist.     Pharynx: Oropharynx is clear. No oropharyngeal exudate or posterior oropharyngeal erythema.  Eyes:     General: No scleral icterus. Cardiovascular:     Rate and Rhythm: Normal rate and regular rhythm.     Heart sounds: Normal heart sounds.  Pulmonary:     Effort: Pulmonary effort is normal. No respiratory distress.     Breath sounds: Normal breath sounds. No wheezing or rhonchi.  Skin:    General: Skin is warm.  Neurological:     Mental Status: She is alert and oriented to person, place, and time.  Psychiatric:        Mood and Affect: Mood normal.        Behavior: Behavior normal.      UC Treatments / Results  Labs (all labs ordered  are listed, but only abnormal results are displayed) Labs  Reviewed - No data to display  EKG   Radiology No results found.  Procedures Procedures (including critical care time)  Medications Ordered in UC Medications - No data to display  Initial Impression / Assessment and Plan / UC Course  I have reviewed the triage vital signs and the nursing notes.  Pertinent labs & imaging results that were available during my care of the patient were reviewed by me and considered in my medical decision making (see chart for details).     Final Clinical Impressions(s) / UC Diagnoses   Final diagnoses:  Sinobronchitis     Discharge Instructions      Albuterol , tessalon , mucinex , prednisone  have been sent to pharmacy to be started today. If symptoms do not improve please follow-up with PCP.     ED Prescriptions     Medication Sig Dispense Auth. Provider   albuterol  (VENTOLIN  HFA) 108 (90 Base) MCG/ACT inhaler Inhale 2 puffs into the lungs every 6 (six) hours as needed for wheezing or shortness of breath. 8 g Andra Krabbe C, PA-C   benzonatate  (TESSALON ) 100 MG capsule Take 1 capsule (100 mg total) by mouth every 8 (eight) hours. 30 capsule Andra Krabbe C, PA-C   guaiFENesin  (MUCINEX ) 600 MG 12 hr tablet Take 1 tablet (600 mg total) by mouth 2 (two) times daily for 10 days. 20 tablet Jilene Spohr C, PA-C   predniSONE  (DELTASONE ) 50 MG tablet Take 1 tab po daily for 5 days 5 tablet Andra Krabbe BROCKS, PA-C      PDMP not reviewed this encounter.    [1]  Social History Tobacco Use   Smoking status: Never    Passive exposure: Never   Smokeless tobacco: Never  Vaping Use   Vaping status: Never Used  Substance Use Topics   Alcohol use: No   Drug use: No     Andra Krabbe BROCKS, PA-C 02/27/24 1655  "

## 2024-02-27 NOTE — Discharge Instructions (Addendum)
 Albuterol , tessalon , mucinex , prednisone  have been sent to pharmacy to be started today. If symptoms do not improve please follow-up with PCP.
